# Patient Record
Sex: Male | Born: 1990 | Race: White | Hispanic: No | Marital: Married | Smoking: Never smoker
Health system: Southern US, Community
[De-identification: ages and names within clinical notes are randomized; demographics above are authoritative.]

## PROBLEM LIST (undated history)

## (undated) DIAGNOSIS — D649 Anemia, unspecified: Secondary | ICD-10-CM

## (undated) DIAGNOSIS — Z960 Presence of urogenital implants: Secondary | ICD-10-CM

## (undated) DIAGNOSIS — R51 Headache: Secondary | ICD-10-CM

## (undated) DIAGNOSIS — Z8709 Personal history of other diseases of the respiratory system: Secondary | ICD-10-CM

## (undated) DIAGNOSIS — R339 Retention of urine, unspecified: Secondary | ICD-10-CM

## (undated) DIAGNOSIS — S82001A Unspecified fracture of right patella, initial encounter for closed fracture: Secondary | ICD-10-CM

## (undated) DIAGNOSIS — S82853A Displaced trimalleolar fracture of unspecified lower leg, initial encounter for closed fracture: Secondary | ICD-10-CM

## (undated) DIAGNOSIS — Z229 Carrier of infectious disease, unspecified: Secondary | ICD-10-CM

## (undated) DIAGNOSIS — R519 Headache, unspecified: Secondary | ICD-10-CM

## (undated) DIAGNOSIS — J45909 Unspecified asthma, uncomplicated: Secondary | ICD-10-CM

## (undated) DIAGNOSIS — S92309A Fracture of unspecified metatarsal bone(s), unspecified foot, initial encounter for closed fracture: Secondary | ICD-10-CM

## (undated) DIAGNOSIS — J189 Pneumonia, unspecified organism: Secondary | ICD-10-CM

## (undated) DIAGNOSIS — R509 Fever, unspecified: Secondary | ICD-10-CM

## (undated) DIAGNOSIS — N390 Urinary tract infection, site not specified: Secondary | ICD-10-CM

## (undated) DIAGNOSIS — T07XXXA Unspecified multiple injuries, initial encounter: Secondary | ICD-10-CM

---
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
## 2009-09-13 ENCOUNTER — Emergency Department (HOSPITAL_COMMUNITY): Admission: EM | Admit: 2009-09-13 | Discharge: 2009-09-13 | Payer: Self-pay | Admitting: Emergency Medicine

## 2011-02-08 ENCOUNTER — Emergency Department (HOSPITAL_COMMUNITY): Payer: No Typology Code available for payment source

## 2011-02-08 ENCOUNTER — Emergency Department (HOSPITAL_COMMUNITY)
Admission: EM | Admit: 2011-02-08 | Discharge: 2011-02-09 | Disposition: A | Discharge status: Home / Self Care | Payer: No Typology Code available for payment source | Attending: Emergency Medicine | Admitting: Emergency Medicine

## 2011-02-08 DIAGNOSIS — S40019A Contusion of unspecified shoulder, initial encounter: Secondary | ICD-10-CM | POA: Insufficient documentation

## 2011-02-08 DIAGNOSIS — IMO0002 Reserved for concepts with insufficient information to code with codable children: Secondary | ICD-10-CM | POA: Insufficient documentation

## 2011-02-23 LAB — RAPID STREP SCREEN (MED CTR MEBANE ONLY): Streptococcus, Group A Screen (Direct): NEGATIVE

## 2015-08-20 ENCOUNTER — Inpatient Hospital Stay (HOSPITAL_COMMUNITY): Payer: BLUE CROSS/BLUE SHIELD | Admitting: Anesthesiology

## 2015-08-20 ENCOUNTER — Inpatient Hospital Stay (HOSPITAL_COMMUNITY): Payer: BLUE CROSS/BLUE SHIELD

## 2015-08-20 ENCOUNTER — Emergency Department (HOSPITAL_COMMUNITY): Payer: BLUE CROSS/BLUE SHIELD

## 2015-08-20 ENCOUNTER — Encounter (HOSPITAL_COMMUNITY): Admission: EM | Disposition: A | Discharge status: Home Health | Payer: Self-pay | Source: Home / Self Care

## 2015-08-20 ENCOUNTER — Inpatient Hospital Stay (HOSPITAL_COMMUNITY)
Admission: EM | Admit: 2015-08-20 | Discharge: 2015-08-25 | DRG: 493 | Disposition: A | Discharge status: Home Health | Payer: BLUE CROSS/BLUE SHIELD | Attending: General Surgery | Admitting: General Surgery

## 2015-08-20 DIAGNOSIS — D62 Acute posthemorrhagic anemia: Secondary | ICD-10-CM | POA: Diagnosis not present

## 2015-08-20 DIAGNOSIS — R339 Retention of urine, unspecified: Secondary | ICD-10-CM | POA: Diagnosis not present

## 2015-08-20 DIAGNOSIS — T1490XA Injury, unspecified, initial encounter: Secondary | ICD-10-CM

## 2015-08-20 DIAGNOSIS — Z229 Carrier of infectious disease, unspecified: Secondary | ICD-10-CM

## 2015-08-20 DIAGNOSIS — S82899A Other fracture of unspecified lower leg, initial encounter for closed fracture: Secondary | ICD-10-CM

## 2015-08-20 DIAGNOSIS — Z419 Encounter for procedure for purposes other than remedying health state, unspecified: Secondary | ICD-10-CM

## 2015-08-20 DIAGNOSIS — T07XXXA Unspecified multiple injuries, initial encounter: Secondary | ICD-10-CM

## 2015-08-20 DIAGNOSIS — S93121A Dislocation of metatarsophalangeal joint of right great toe, initial encounter: Secondary | ICD-10-CM | POA: Diagnosis present

## 2015-08-20 DIAGNOSIS — D181 Lymphangioma, any site: Secondary | ICD-10-CM | POA: Diagnosis present

## 2015-08-20 DIAGNOSIS — D649 Anemia, unspecified: Secondary | ICD-10-CM

## 2015-08-20 DIAGNOSIS — R338 Other retention of urine: Secondary | ICD-10-CM | POA: Diagnosis not present

## 2015-08-20 DIAGNOSIS — S82852A Displaced trimalleolar fracture of left lower leg, initial encounter for closed fracture: Secondary | ICD-10-CM | POA: Diagnosis present

## 2015-08-20 DIAGNOSIS — S82892A Other fracture of left lower leg, initial encounter for closed fracture: Secondary | ICD-10-CM | POA: Diagnosis present

## 2015-08-20 DIAGNOSIS — S82001A Unspecified fracture of right patella, initial encounter for closed fracture: Secondary | ICD-10-CM | POA: Diagnosis present

## 2015-08-20 DIAGNOSIS — M25512 Pain in left shoulder: Secondary | ICD-10-CM | POA: Diagnosis present

## 2015-08-20 DIAGNOSIS — S93326A Dislocation of tarsometatarsal joint of unspecified foot, initial encounter: Secondary | ICD-10-CM

## 2015-08-20 DIAGNOSIS — K5900 Constipation, unspecified: Secondary | ICD-10-CM | POA: Diagnosis not present

## 2015-08-20 DIAGNOSIS — S82009A Unspecified fracture of unspecified patella, initial encounter for closed fracture: Secondary | ICD-10-CM

## 2015-08-20 DIAGNOSIS — S82853A Displaced trimalleolar fracture of unspecified lower leg, initial encounter for closed fracture: Secondary | ICD-10-CM

## 2015-08-20 DIAGNOSIS — T1490 Injury, unspecified: Secondary | ICD-10-CM | POA: Diagnosis present

## 2015-08-20 DIAGNOSIS — S92902A Unspecified fracture of left foot, initial encounter for closed fracture: Secondary | ICD-10-CM | POA: Diagnosis present

## 2015-08-20 DIAGNOSIS — S92309A Fracture of unspecified metatarsal bone(s), unspecified foot, initial encounter for closed fracture: Secondary | ICD-10-CM

## 2015-08-20 DIAGNOSIS — S92901A Unspecified fracture of right foot, initial encounter for closed fracture: Secondary | ICD-10-CM | POA: Diagnosis present

## 2015-08-20 HISTORY — DX: Fracture of unspecified metatarsal bone(s), unspecified foot, initial encounter for closed fracture: S92.309A

## 2015-08-20 HISTORY — DX: Anemia, unspecified: D64.9

## 2015-08-20 HISTORY — DX: Carrier of infectious disease, unspecified: Z22.9

## 2015-08-20 HISTORY — DX: Unspecified multiple injuries, initial encounter: T07.XXXA

## 2015-08-20 HISTORY — PX: CLOSED REDUCTION METATARSAL: SHX5774

## 2015-08-20 HISTORY — PX: EXTERNAL FIXATION LEG: SHX1549

## 2015-08-20 HISTORY — DX: Displaced trimalleolar fracture of unspecified lower leg, initial encounter for closed fracture: S82.853A

## 2015-08-20 HISTORY — DX: Unspecified fracture of right patella, initial encounter for closed fracture: S82.001A

## 2015-08-20 LAB — CDS SEROLOGY

## 2015-08-20 LAB — CBC
HCT: 45.7 % (ref 39.0–52.0)
HEMOGLOBIN: 15.4 g/dL (ref 13.0–17.0)
MCH: 28.7 pg (ref 26.0–34.0)
MCHC: 33.7 g/dL (ref 30.0–36.0)
MCV: 85.1 fL (ref 78.0–100.0)
PLATELETS: 297 10*3/uL (ref 150–400)
RBC: 5.37 MIL/uL (ref 4.22–5.81)
RDW: 12.2 % (ref 11.5–15.5)
WBC: 11.1 10*3/uL — AB (ref 4.0–10.5)

## 2015-08-20 LAB — COMPREHENSIVE METABOLIC PANEL
ALK PHOS: 43 U/L (ref 38–126)
ALT: 26 U/L (ref 17–63)
ANION GAP: 13 (ref 5–15)
AST: 39 U/L (ref 15–41)
Albumin: 4.7 g/dL (ref 3.5–5.0)
BUN: 9 mg/dL (ref 6–20)
CALCIUM: 9.9 mg/dL (ref 8.9–10.3)
CO2: 23 mmol/L (ref 22–32)
Chloride: 105 mmol/L (ref 101–111)
Creatinine, Ser: 0.93 mg/dL (ref 0.61–1.24)
Glucose, Bld: 132 mg/dL — ABNORMAL HIGH (ref 65–99)
Potassium: 3.7 mmol/L (ref 3.5–5.1)
SODIUM: 141 mmol/L (ref 135–145)
TOTAL PROTEIN: 7.5 g/dL (ref 6.5–8.1)
Total Bilirubin: 1.4 mg/dL — ABNORMAL HIGH (ref 0.3–1.2)

## 2015-08-20 LAB — SURGICAL PCR SCREEN
MRSA, PCR: NEGATIVE
Staphylococcus aureus: POSITIVE — AB

## 2015-08-20 LAB — ETHANOL

## 2015-08-20 LAB — SAMPLE TO BLOOD BANK

## 2015-08-20 LAB — PROTIME-INR
INR: 1.14 (ref 0.00–1.49)
PROTHROMBIN TIME: 14.8 s (ref 11.6–15.2)

## 2015-08-20 SURGERY — EXTERNAL FIXATION, LOWER EXTREMITY
Anesthesia: General | Laterality: Right

## 2015-08-20 MED ORDER — DOCUSATE SODIUM 100 MG PO CAPS
100.0000 mg | ORAL_CAPSULE | Freq: Two times a day (BID) | ORAL | Status: DC
  Administered 2015-08-21 – 2015-08-24 (×6): 100 mg via ORAL
  Filled 2015-08-20 (×7): qty 1

## 2015-08-20 MED ORDER — HYDROMORPHONE HCL 1 MG/ML IJ SOLN
1.0000 mg | INTRAMUSCULAR | Status: DC | PRN
  Administered 2015-08-20: 1 mg via INTRAVENOUS
  Filled 2015-08-20: qty 1

## 2015-08-20 MED ORDER — GLYCOPYRROLATE 0.2 MG/ML IJ SOLN
INTRAMUSCULAR | Status: DC | PRN
  Administered 2015-08-20: 0.4 mg via INTRAVENOUS

## 2015-08-20 MED ORDER — HYDROMORPHONE HCL 1 MG/ML IJ SOLN
0.2500 mg | INTRAMUSCULAR | Status: DC | PRN
  Administered 2015-08-20 (×2): 0.5 mg via INTRAVENOUS

## 2015-08-20 MED ORDER — ROCURONIUM BROMIDE 50 MG/5ML IV SOLN
INTRAVENOUS | Status: AC
  Filled 2015-08-20: qty 1

## 2015-08-20 MED ORDER — PANTOPRAZOLE SODIUM 40 MG IV SOLR: 40.0000 mg | Freq: Every day | INTRAVENOUS | Status: DC

## 2015-08-20 MED ORDER — MIDAZOLAM HCL 2 MG/2ML IJ SOLN
INTRAMUSCULAR | Status: AC
  Filled 2015-08-20: qty 4

## 2015-08-20 MED ORDER — DEXAMETHASONE SODIUM PHOSPHATE 10 MG/ML IJ SOLN
INTRAMUSCULAR | Status: DC | PRN
  Administered 2015-08-20: 10 mg via INTRAVENOUS

## 2015-08-20 MED ORDER — FENTANYL CITRATE (PF) 100 MCG/2ML IJ SOLN
INTRAMUSCULAR | Status: DC | PRN
  Administered 2015-08-20: 150 ug via INTRAVENOUS
  Administered 2015-08-20: 100 ug via INTRAVENOUS

## 2015-08-20 MED ORDER — PANTOPRAZOLE SODIUM 40 MG PO TBEC
40.0000 mg | DELAYED_RELEASE_TABLET | Freq: Every day | ORAL | Status: DC
  Administered 2015-08-21 – 2015-08-24 (×4): 40 mg via ORAL
  Filled 2015-08-20 (×4): qty 1

## 2015-08-20 MED ORDER — GLYCOPYRROLATE 0.2 MG/ML IJ SOLN
INTRAMUSCULAR | Status: AC
  Filled 2015-08-20: qty 4

## 2015-08-20 MED ORDER — HYDROMORPHONE HCL 1 MG/ML IJ SOLN
1.0000 mg | Freq: Once | INTRAMUSCULAR | Status: AC
Start: 2015-08-20 — End: 2015-08-20
  Administered 2015-08-20: 1 mg via INTRAVENOUS
  Filled 2015-08-20: qty 1

## 2015-08-20 MED ORDER — LACTATED RINGERS IV SOLN
INTRAVENOUS | Status: DC
  Administered 2015-08-21: 06:00:00 via INTRAVENOUS
  Administered 2015-08-22: 85 mL/h via INTRAVENOUS

## 2015-08-20 MED ORDER — METHOCARBAMOL 1000 MG/10ML IJ SOLN
500.0000 mg | Freq: Four times a day (QID) | INTRAVENOUS | Status: DC | PRN
  Administered 2015-08-21 (×2): 500 mg via INTRAVENOUS
  Filled 2015-08-20 (×4): qty 5

## 2015-08-20 MED ORDER — ACETAMINOPHEN 10 MG/ML IV SOLN
INTRAVENOUS | Status: AC
  Filled 2015-08-20: qty 100

## 2015-08-20 MED ORDER — HYDROMORPHONE HCL 1 MG/ML IJ SOLN
INTRAMUSCULAR | Status: AC
  Filled 2015-08-20: qty 1

## 2015-08-20 MED ORDER — CEFAZOLIN SODIUM-DEXTROSE 2-3 GM-% IV SOLR
INTRAVENOUS | Status: DC | PRN
  Administered 2015-08-20: 2 g via INTRAVENOUS

## 2015-08-20 MED ORDER — BACIT-POLY-NEO HC 1 % EX OINT
TOPICAL_OINTMENT | CUTANEOUS | Status: AC
  Filled 2015-08-20: qty 15

## 2015-08-20 MED ORDER — SILVER SULFADIAZINE 1 % EX CREA
TOPICAL_CREAM | Freq: Two times a day (BID) | CUTANEOUS | Status: DC
  Administered 2015-08-22 – 2015-08-25 (×7): via TOPICAL
  Filled 2015-08-20 (×4): qty 85

## 2015-08-20 MED ORDER — BACITRACIN ZINC 500 UNIT/GM EX OINT
TOPICAL_OINTMENT | CUTANEOUS | Status: AC
  Filled 2015-08-20: qty 28.35

## 2015-08-20 MED ORDER — TETANUS-DIPHTH-ACELL PERTUSSIS 5-2.5-18.5 LF-MCG/0.5 IM SUSP
0.5000 mL | Freq: Once | INTRAMUSCULAR | Status: AC
  Administered 2015-08-20: 0.5 mL via INTRAMUSCULAR
  Filled 2015-08-20: qty 0.5

## 2015-08-20 MED ORDER — ONDANSETRON HCL 4 MG/2ML IJ SOLN
INTRAMUSCULAR | Status: DC | PRN
  Administered 2015-08-20: 4 mg via INTRAVENOUS

## 2015-08-20 MED ORDER — LIDOCAINE HCL (CARDIAC) 20 MG/ML IV SOLN
INTRAVENOUS | Status: AC
  Filled 2015-08-20: qty 5

## 2015-08-20 MED ORDER — PROPOFOL 10 MG/ML IV BOLUS
INTRAVENOUS | Status: DC | PRN
  Administered 2015-08-20: 150 mg via INTRAVENOUS

## 2015-08-20 MED ORDER — ACETAMINOPHEN 10 MG/ML IV SOLN
INTRAVENOUS | Status: DC | PRN
  Administered 2015-08-20: 1000 mg via INTRAVENOUS

## 2015-08-20 MED ORDER — ONDANSETRON HCL 4 MG/2ML IJ SOLN
INTRAMUSCULAR | Status: AC
  Filled 2015-08-20: qty 2

## 2015-08-20 MED ORDER — LACTATED RINGERS IV SOLN
INTRAVENOUS | Status: DC | PRN
  Administered 2015-08-20 (×2): via INTRAVENOUS

## 2015-08-20 MED ORDER — ONDANSETRON HCL 4 MG PO TABS: 4.0000 mg | ORAL_TABLET | Freq: Four times a day (QID) | ORAL | Status: DC | PRN

## 2015-08-20 MED ORDER — SILVER SULFADIAZINE 1 % EX CREA
TOPICAL_CREAM | Freq: Once | CUTANEOUS | Status: AC
  Administered 2015-08-20: 1 via TOPICAL
  Filled 2015-08-20: qty 85

## 2015-08-20 MED ORDER — PROPOFOL 10 MG/ML IV BOLUS
INTRAVENOUS | Status: AC
  Filled 2015-08-20: qty 20

## 2015-08-20 MED ORDER — ALBUMIN HUMAN 5 % IV SOLN
INTRAVENOUS | Status: DC | PRN
  Administered 2015-08-20: 22:00:00 via INTRAVENOUS

## 2015-08-20 MED ORDER — SUCCINYLCHOLINE CHLORIDE 200 MG/10ML IV SOSY
PREFILLED_SYRINGE | INTRAVENOUS | Status: DC | PRN
  Administered 2015-08-20: 120 mg via INTRAVENOUS

## 2015-08-20 MED ORDER — MIDAZOLAM HCL 5 MG/5ML IJ SOLN
INTRAMUSCULAR | Status: DC | PRN
  Administered 2015-08-20: 2 mg via INTRAVENOUS

## 2015-08-20 MED ORDER — POTASSIUM CHLORIDE IN NACL 20-0.45 MEQ/L-% IV SOLN
INTRAVENOUS | Status: DC
  Administered 2015-08-20: 19:00:00 via INTRAVENOUS
  Filled 2015-08-20 (×6): qty 1000

## 2015-08-20 MED ORDER — METHOCARBAMOL 500 MG PO TABS
500.0000 mg | ORAL_TABLET | Freq: Four times a day (QID) | ORAL | Status: DC | PRN
  Administered 2015-08-22: 500 mg via ORAL
  Filled 2015-08-20: qty 1

## 2015-08-20 MED ORDER — HYDROMORPHONE HCL 1 MG/ML IJ SOLN
1.0000 mg | Freq: Once | INTRAMUSCULAR | Status: AC
  Administered 2015-08-20: 1 mg via INTRAVENOUS
  Filled 2015-08-20: qty 1

## 2015-08-20 MED ORDER — NEOSTIGMINE METHYLSULFATE 10 MG/10ML IV SOLN
INTRAVENOUS | Status: AC
  Filled 2015-08-20: qty 1

## 2015-08-20 MED ORDER — CEFAZOLIN (ANCEF) 1 G IV SOLR: 2.0000 g | INTRAVENOUS | Status: DC

## 2015-08-20 MED ORDER — DEXAMETHASONE SODIUM PHOSPHATE 10 MG/ML IJ SOLN
INTRAMUSCULAR | Status: AC
  Filled 2015-08-20: qty 1

## 2015-08-20 MED ORDER — PROMETHAZINE HCL 25 MG/ML IJ SOLN
6.2500 mg | INTRAMUSCULAR | Status: DC | PRN
Start: 2015-08-20 — End: 2015-08-21

## 2015-08-20 MED ORDER — FENTANYL CITRATE (PF) 250 MCG/5ML IJ SOLN
INTRAMUSCULAR | Status: AC
  Filled 2015-08-20: qty 5

## 2015-08-20 MED ORDER — OXYCODONE HCL 5 MG PO TABS
5.0000 mg | ORAL_TABLET | ORAL | Status: DC | PRN
  Administered 2015-08-21 – 2015-08-25 (×20): 15 mg via ORAL
  Filled 2015-08-20 (×22): qty 3

## 2015-08-20 MED ORDER — CEFAZOLIN SODIUM 1-5 GM-% IV SOLN
1.0000 g | Freq: Once | INTRAVENOUS | Status: AC
  Administered 2015-08-20: 1 g via INTRAVENOUS
  Filled 2015-08-20: qty 50

## 2015-08-20 MED ORDER — HYDROMORPHONE HCL 1 MG/ML IJ SOLN
2.0000 mg | Freq: Once | INTRAMUSCULAR | Status: AC
  Administered 2015-08-20: 2 mg via INTRAVENOUS
  Filled 2015-08-20: qty 2

## 2015-08-20 MED ORDER — NEOSTIGMINE METHYLSULFATE 10 MG/10ML IV SOLN
INTRAVENOUS | Status: DC | PRN
  Administered 2015-08-20: 3 mg via INTRAVENOUS

## 2015-08-20 MED ORDER — POLYETHYLENE GLYCOL 3350 17 G PO PACK
17.0000 g | PACK | Freq: Every day | ORAL | Status: DC
  Administered 2015-08-21 – 2015-08-25 (×4): 17 g via ORAL
  Filled 2015-08-20 (×4): qty 1

## 2015-08-20 MED ORDER — ROCURONIUM BROMIDE 100 MG/10ML IV SOLN
INTRAVENOUS | Status: DC | PRN
  Administered 2015-08-20: 20 mg via INTRAVENOUS
  Administered 2015-08-20: 30 mg via INTRAVENOUS

## 2015-08-20 MED ORDER — BACIT-POLY-NEO HC 1 % EX OINT
TOPICAL_OINTMENT | CUTANEOUS | Status: DC | PRN
  Administered 2015-08-20: 1 via TOPICAL

## 2015-08-20 MED ORDER — ONDANSETRON HCL 4 MG/2ML IJ SOLN
4.0000 mg | Freq: Four times a day (QID) | INTRAMUSCULAR | Status: DC | PRN
  Administered 2015-08-21 – 2015-08-24 (×2): 4 mg via INTRAVENOUS
  Filled 2015-08-20 (×2): qty 2

## 2015-08-20 SURGICAL SUPPLY — 42 items
BANDAGE ELASTIC 4 VELCRO ST LF (GAUZE/BANDAGES/DRESSINGS) ×4 IMPLANT
BANDAGE ELASTIC 6 VELCRO ST LF (GAUZE/BANDAGES/DRESSINGS) ×4 IMPLANT
BAR EXFX 400X11 NS LF (EXFIX) ×2
BAR GLASS FIBER EXFX 11X400 (EXFIX) ×2 IMPLANT
BNDG CONFORM 3 STRL LF (GAUZE/BANDAGES/DRESSINGS) ×2 IMPLANT
CLAMP BLUE BAR TO PIN (MISCELLANEOUS) ×4 IMPLANT
COTTON STERILE ROLL (GAUZE/BANDAGES/DRESSINGS) ×2 IMPLANT
DRAPE C-ARM 42X72 X-RAY (DRAPES) ×2 IMPLANT
DRAPE U-SHAPE 47X51 STRL (DRAPES) ×4 IMPLANT
DRSG ADAPTIC 3X8 NADH LF (GAUZE/BANDAGES/DRESSINGS) ×4 IMPLANT
DRSG PAD ABDOMINAL 8X10 ST (GAUZE/BANDAGES/DRESSINGS) ×4 IMPLANT
DURAPREP 26ML APPLICATOR (WOUND CARE) ×4 IMPLANT
ELECT REM PT RETURN 9FT ADLT (ELECTROSURGICAL) ×4
ELECTRODE REM PT RTRN 9FT ADLT (ELECTROSURGICAL) ×2 IMPLANT
GAUZE XEROFORM 5X9 LF (GAUZE/BANDAGES/DRESSINGS) ×2 IMPLANT
GLOVE BIO SURGEON STRL SZ8 (GLOVE) ×4 IMPLANT
GLOVE BIOGEL PI IND STRL 8 (GLOVE) ×4 IMPLANT
GLOVE BIOGEL PI INDICATOR 8 (GLOVE) ×4
GLOVE ORTHO TXT STRL SZ7.5 (GLOVE) ×4 IMPLANT
GOWN STRL REUS W/ TWL LRG LVL3 (GOWN DISPOSABLE) ×2 IMPLANT
GOWN STRL REUS W/TWL 2XL LVL3 (GOWN DISPOSABLE) ×8 IMPLANT
GOWN STRL REUS W/TWL LRG LVL3 (GOWN DISPOSABLE) ×4
HALF PIN 5.0X160 (PIN) ×4 IMPLANT
IMMOBILIZER KNEE 20 (SOFTGOODS) ×4
IMMOBILIZER KNEE 20 THIGH 36 (SOFTGOODS) IMPLANT
KIT BASIN OR (CUSTOM PROCEDURE TRAY) ×4 IMPLANT
KIT ROOM TURNOVER OR (KITS) ×4 IMPLANT
NS IRRIG 1000ML POUR BTL (IV SOLUTION) ×4 IMPLANT
PACK ORTHO EXTREMITY (CUSTOM PROCEDURE TRAY) ×4 IMPLANT
PAD ARMBOARD 7.5X6 YLW CONV (MISCELLANEOUS) ×6 IMPLANT
PAD CAST 4YDX4 CTTN HI CHSV (CAST SUPPLIES) ×2 IMPLANT
PADDING CAST ABS 4INX4YD NS (CAST SUPPLIES) ×2
PADDING CAST ABS COTTON 4X4 ST (CAST SUPPLIES) IMPLANT
PADDING CAST COTTON 4X4 STRL (CAST SUPPLIES) ×4
PIN CLAMP 2BAR 75MM BLUE (PIN) ×2 IMPLANT
PIN TRANSFIXING 5.0 (PIN) ×2 IMPLANT
SPLINT PLASTER CAST XFAST 5X30 (CAST SUPPLIES) IMPLANT
SPLINT PLASTER XFAST SET 5X30 (CAST SUPPLIES) ×2
STOCKINETTE 6  STRL (DRAPES) ×2
STOCKINETTE 6 STRL (DRAPES) ×2 IMPLANT
TOWEL OR 17X24 6PK STRL BLUE (TOWEL DISPOSABLE) ×4 IMPLANT
TOWEL OR 17X26 10 PK STRL BLUE (TOWEL DISPOSABLE) ×4 IMPLANT

## 2015-08-20 NOTE — ED Notes (Addendum)
Warm blankets times 3 and applied ice packs to left ankle and right ankle
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
## 2015-08-20 NOTE — Anesthesia Preprocedure Evaluation (Addendum)
Anesthesia Evaluation  Patient identified by MRN, date of birth, ID band Patient awake    Reviewed: Allergy & Precautions, NPO status , Patient's Chart, lab work & pertinent test results  Airway Mallampati: I  TM Distance: >3 FB Neck ROM: Full    Dental  (+) Teeth Intact, Dental Advisory Given   Pulmonary neg pulmonary ROS,    Pulmonary exam normal        Cardiovascular negative cardio ROS Normal cardiovascular exam     Neuro/Psych negative neurological ROS  negative psych ROS   GI/Hepatic negative GI ROS, Neg liver ROS,   Endo/Other  negative endocrine ROS  Renal/GU negative Renal ROS  negative genitourinary   Musculoskeletal negative musculoskeletal ROS (+)   Abdominal   Peds negative pediatric ROS (+)  Hematology negative hematology ROS (+)   Anesthesia Other Findings   Reproductive/Obstetrics negative OB ROS                            Anesthesia Physical Anesthesia Plan  ASA: II and emergent  Anesthesia Plan: General   Post-op Pain Management:    Induction: Intravenous, Rapid sequence and Cricoid pressure planned  Airway Management Planned: Oral ETT  Additional Equipment:   Intra-op Plan:   Post-operative Plan: Extubation in OR  Informed Consent: I have reviewed the patients History and Physical, chart, labs and discussed the procedure including the risks, benefits and alternatives for the proposed anesthesia with the patient or authorized representative who has indicated his/her understanding and acceptance.   Dental advisory given  Plan Discussed with: CRNA, Anesthesiologist and Surgeon  Anesthesia Plan Comments:        Anesthesia Quick Evaluation

## 2015-08-20 NOTE — Brief Op Note (Signed)
08/20/2015  11:18 PM  PATIENT:  Nathaniel Aguilar  24 y.o. male  PRE-OPERATIVE DIAGNOSIS:  Fractured ankle  POST-OPERATIVE DIAGNOSIS:  Fractured ankle  PROCEDURE:  Procedure(s): EXTERNAL FIXATION LEG (Left) CLOSED REDUCTION METATARSAL (Right)  SURGEON:  Surgeon(s) and Role:    * Melina Schools, MD - Primary  PHYSICIAN ASSISTANT:   ASSISTANTS: Carmen Mayo   ANESTHESIA:   general  EBL:  Total I/O In: 8250 [I.V.:1200; IV Piggyback:250] Out: -   BLOOD ADMINISTERED:none  DRAINS: none   LOCAL MEDICATIONS USED:  NONE  SPECIMEN:  No Specimen  DISPOSITION OF SPECIMEN:  N/A  COUNTS:  YES  TOURNIQUET:  * No tourniquets in log *  DICTATION: .Other Dictation: Dictation Number [id number]  PLAN OF CARE: Admit to inpatient   PATIENT DISPOSITION:  PACU - hemodynamically stable.

## 2015-08-20 NOTE — Transfer of Care (Signed)
Immediate Anesthesia Transfer of Care Note  Patient: Nathaniel Aguilar  Procedure(s) Performed: Procedure(s): EXTERNAL FIXATION LEG (Left) CLOSED REDUCTION METATARSAL (Right)  Patient Location: PACU  Anesthesia Type:General  Level of Consciousness: responds to stimulation  Airway & Oxygen Therapy: Patient Spontanous Breathing and Patient connected to face mask oxygen  Post-op Assessment: Report given to RN and Post -op Vital signs reviewed and stable  Post vital signs: Reviewed and stable  Last Vitals:  Filed Vitals:   08/20/15 2313  BP:   Pulse: 79  Temp: 36.6 C  Resp: 9    Complications: No apparent anesthesia complications

## 2015-08-20 NOTE — Anesthesia Procedure Notes (Signed)
Procedure Name: Intubation Date/Time: 08/20/2015 9:44 PM Performed by: Ignacia Bayley Pre-anesthesia Checklist: Patient identified, Emergency Drugs available, Suction available and Patient being monitored Patient Re-evaluated:Patient Re-evaluated prior to inductionOxygen Delivery Method: Circle system utilized Preoxygenation: Pre-oxygenation with 100% oxygen Intubation Type: IV induction, Cricoid Pressure applied and Rapid sequence Laryngoscope Size: Miller and 2 Grade View: Grade I Tube type: Oral Tube size: 7.5 mm Number of attempts: 1 Airway Equipment and Method: Stylet Placement Confirmation: ETT inserted through vocal cords under direct vision,  positive ETCO2,  CO2 detector and breath sounds checked- equal and bilateral Secured at: 22 cm Tube secured with: Tape Dental Injury: Teeth and Oropharynx as per pre-operative assessment

## 2015-08-20 NOTE — Progress Notes (Signed)
Orthopedic Tech Progress Note Patient Details:  Nathaniel Aguilar 05-01-1991 [id number]  Ortho Devices Type of Ortho Device: Ace wrap, Post (short leg) splint, Stirrup splint Ortho Device/Splint Location: Bilateral short leg splints with stirrups Ortho Device/Splint Interventions: Application   Nathaniel Aguilar 08/20/2015, 5:21 PM
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
## 2015-08-21 ENCOUNTER — Inpatient Hospital Stay (HOSPITAL_COMMUNITY): Payer: BLUE CROSS/BLUE SHIELD

## 2015-08-21 MED ORDER — MUPIROCIN 2 % EX OINT
TOPICAL_OINTMENT | Freq: Two times a day (BID) | CUTANEOUS | Status: DC
  Administered 2015-08-21: 1 via NASAL
  Administered 2015-08-21 – 2015-08-25 (×8): via NASAL
  Filled 2015-08-21 (×2): qty 22

## 2015-08-21 MED ORDER — ACETAMINOPHEN 325 MG PO TABS
650.0000 mg | ORAL_TABLET | Freq: Four times a day (QID) | ORAL | Status: DC | PRN
Start: 2015-08-21 — End: 2015-08-25
  Administered 2015-08-23 – 2015-08-24 (×3): 650 mg via ORAL
  Filled 2015-08-21 (×4): qty 2

## 2015-08-21 MED ORDER — METOCLOPRAMIDE HCL 5 MG/ML IJ SOLN
5.0000 mg | Freq: Three times a day (TID) | INTRAMUSCULAR | Status: DC | PRN
  Administered 2015-08-24: 10 mg via INTRAVENOUS
  Filled 2015-08-21: qty 2

## 2015-08-21 MED ORDER — MORPHINE SULFATE (PF) 2 MG/ML IV SOLN
2.0000 mg | INTRAVENOUS | Status: DC | PRN
  Administered 2015-08-21 – 2015-08-24 (×14): 2 mg via INTRAVENOUS
  Filled 2015-08-21 (×14): qty 1

## 2015-08-21 MED ORDER — ONDANSETRON HCL 4 MG PO TABS: 4.0000 mg | ORAL_TABLET | Freq: Four times a day (QID) | ORAL | Status: DC | PRN

## 2015-08-21 MED ORDER — METOCLOPRAMIDE HCL 5 MG PO TABS: 5.0000 mg | ORAL_TABLET | Freq: Three times a day (TID) | ORAL | Status: DC | PRN

## 2015-08-21 MED ORDER — ONDANSETRON HCL 4 MG/2ML IJ SOLN: 4.0000 mg | Freq: Four times a day (QID) | INTRAMUSCULAR | Status: DC | PRN

## 2015-08-21 MED ORDER — OXYCODONE HCL 5 MG PO TABS: 5.0000 mg | ORAL_TABLET | ORAL | Status: DC | PRN

## 2015-08-21 MED ORDER — CEFAZOLIN SODIUM 1-5 GM-% IV SOLN
1.0000 g | Freq: Four times a day (QID) | INTRAVENOUS | Status: AC
  Administered 2015-08-21 (×3): 1 g via INTRAVENOUS
  Filled 2015-08-21 (×3): qty 50

## 2015-08-21 MED ORDER — ACETAMINOPHEN 650 MG RE SUPP: 650.0000 mg | Freq: Four times a day (QID) | RECTAL | Status: DC | PRN

## 2015-08-21 MED ORDER — ENOXAPARIN SODIUM 40 MG/0.4ML ~~LOC~~ SOLN: 40.0000 mg | SUBCUTANEOUS | Status: DC

## 2015-08-21 MED ORDER — ENOXAPARIN SODIUM 40 MG/0.4ML ~~LOC~~ SOLN
40.0000 mg | SUBCUTANEOUS | Status: DC
  Administered 2015-08-21 – 2015-08-24 (×4): 40 mg via SUBCUTANEOUS
  Filled 2015-08-21 (×4): qty 0.4
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
## 2015-08-21 NOTE — Progress Notes (Signed)
Patient ID: Gabreil [place], male   DOB: Aug 11, 1991, 24 y.o.   MRN: [id number] Subjective: 1 Day Post-Op Procedure(s) (LRB): EXTERNAL FIXATION LEG (Left) CLOSED REDUCTION METATARSAL (Right)    Patient stable no events.  Objective:   VITALS:   Filed Vitals:   08/21/15 2100  BP: 142/71  Pulse: 113  Temp: 99.3 F (37.4 C)  Resp: 18    External fixator intact Splints both lower extremities No significant pain complaints Swelling appropriate  LABS  Recent Labs  08/20/15 1257  HGB 15.4  HCT 45.7  WBC 11.1*  PLT 297     Recent Labs  08/20/15 1257  NA 141  K 3.7  BUN 9  CREATININE 0.93  GLUCOSE 132*     Recent Labs  08/20/15 1257  INR 1.14     Assessment/Plan: 1 Day Post-Op Procedure(s) (LRB): EXTERNAL FIXATION LEG (Left) CLOSED REDUCTION METATARSAL (Right)   Plan:  As discussed with Dr. Rolena Infante he will be consulting Dr. Marcelino Scot for definitive management of his lower extremity injuries Monitor swelling NWB BLE

## 2015-08-21 NOTE — Anesthesia Postprocedure Evaluation (Signed)
Anesthesia Post Note  Patient: Nathaniel Aguilar  Procedure(s) Performed: Procedure(s) (LRB): EXTERNAL FIXATION LEG (Left) CLOSED REDUCTION METATARSAL (Right)  Anesthesia type: general  Patient location: PACU  Post pain: Pain level controlled  Post assessment: Patient's Cardiovascular Status Stable  Last Vitals:  Filed Vitals:   08/21/15 0034  BP: 156/85  Pulse: 99  Temp: 36.7 C  Resp: 18    Post vital signs: Reviewed and stable  Level of consciousness: sedated  Complications: No apparent anesthesia complications
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
## 2015-08-21 NOTE — Progress Notes (Signed)
Lab came to draw blood on the patient. They were unable to gain venous access due to to the patient's dressings to upper and lower extremities. Called Dr. Hulen Skains and he stated to "hold off on the labs for now". Will continue to monitor.
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
## 2015-08-22 ENCOUNTER — Encounter (HOSPITAL_COMMUNITY): Payer: Self-pay | Admitting: Orthopedic Surgery

## 2015-08-22 LAB — CBC
HEMATOCRIT: 35.1 % — AB (ref 39.0–52.0)
Hemoglobin: 11.7 g/dL — ABNORMAL LOW (ref 13.0–17.0)
MCH: 29 pg (ref 26.0–34.0)
MCHC: 33.3 g/dL (ref 30.0–36.0)
MCV: 87.1 fL (ref 78.0–100.0)
PLATELETS: 192 10*3/uL (ref 150–400)
RBC: 4.03 MIL/uL — ABNORMAL LOW (ref 4.22–5.81)
RDW: 12.2 % (ref 11.5–15.5)
WBC: 9.1 10*3/uL (ref 4.0–10.5)

## 2015-08-22 LAB — BASIC METABOLIC PANEL
ANION GAP: 7 (ref 5–15)
BUN: 8 mg/dL (ref 6–20)
CALCIUM: 9 mg/dL (ref 8.9–10.3)
CO2: 31 mmol/L (ref 22–32)
Chloride: 100 mmol/L — ABNORMAL LOW (ref 101–111)
Creatinine, Ser: 0.96 mg/dL (ref 0.61–1.24)
Glucose, Bld: 121 mg/dL — ABNORMAL HIGH (ref 65–99)
Potassium: 3.9 mmol/L (ref 3.5–5.1)
SODIUM: 138 mmol/L (ref 135–145)

## 2015-08-22 MED ORDER — METHOCARBAMOL 1000 MG/10ML IJ SOLN
1000.0000 mg | Freq: Four times a day (QID) | INTRAVENOUS | Status: DC | PRN
  Filled 2015-08-22: qty 10

## 2015-08-22 MED ORDER — METHOCARBAMOL 500 MG PO TABS
1000.0000 mg | ORAL_TABLET | Freq: Four times a day (QID) | ORAL | Status: DC | PRN
  Administered 2015-08-22 – 2015-08-25 (×8): 1000 mg via ORAL
  Filled 2015-08-22 (×9): qty 2
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
## 2015-08-22 NOTE — Progress Notes (Addendum)
Nathaniel Aguilar  MRN: [id number] DOB/Age: Apr 04, 1991 24 y.o. Physician: Ander Slade, M.D. 2 Days Post-Op Procedure(s) (LRB): EXTERNAL FIXATION LEG (Left) CLOSED REDUCTION METATARSAL (Right)  Subjective: Resting comfortably in bedside chair, A and O, family at bedside Vital Signs Temp:  [98.2 F (36.8 C)-99.6 F (37.6 C)] 99.6 F (37.6 C) (10/02 0457) Pulse Rate:  [109-115] 115 (10/02 0457) Resp:  [18] 18 (10/02 0457) BP: (128-144)/(71-75) 144/72 mmHg (10/02 0457) SpO2:  [96 %-100 %] 96 % (10/02 0457)  Lab Results  Recent Labs  08/20/15 1257  WBC 11.1*  HGB 15.4  HCT 45.7  PLT 297   BMET  Recent Labs  08/20/15 1257  NA 141  K 3.7  CL 105  CO2 23  GLUCOSE 132*  BUN 9  CREATININE 0.93  CALCIUM 9.9   INR  Date Value Ref Range Status  08/20/2015 1.14 0.00 - 1.49 Final     Exam  Right knee with diffuse swelling and resolving ecchymosis, lateral superficial skin abrasion. Right ankle splinted. Ex fix to LLE, dressings intact  CT's reviewed: Complex left ankle fracture dislocation with continued subluxation Comminuted fractures left midfoot. Non displaced right inferior pole patella fracture.  Plan Anticipate review of orthopedic injuries by trauma specialist tomorrow. Will place NPO after midnight incase of surgical opportunity tomorrow  Nathaniel Aguilar M Caley Volkert 08/22/2015, 11:01 AM    Contact # [phone] Additional findings:Right midfoot fractures as noted on recent CT

## 2015-08-22 NOTE — Progress Notes (Signed)
Patient/patients mother have concerns because the patient went from 2128 until 0451 without pain medication. During hourly rounds completed during this gap in pain medication administration the patient was asleep and RN did not awaken patient. RN informed patient/patients mother that it is pertinent that the patient hit the call button for pain medications when he needs it because pain medications are not scheduled and only administered on an as needed basis. Patient/patients mother expressed that they did not know that pain medications were not scheduled and assumed that the RN would bring in the medication without an indication to do so. Patient/patients mother expressed that the patient would like an RN to awaken him every 1-2 hours to administer him pain medication even if the patient is asleep. RN will report off to day shift RN the requested plan of care. RN will also report off to day shift RN per order set dressing change that needs to be completed. Nursing will continue to monitor.
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
## 2015-08-22 NOTE — Progress Notes (Signed)
Removed the indwelling foley @1130  per MD order. Patient was not able to void. Scan showed 928 cc of urinary retention @ 1620. Attempted a I/O cath, but feel resist and was not able to get urine. Notified Dr. Ninfa Linden, received an order for indwelling foley. 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 Notified Dr. Ninfa Linden and get the order for cude. Asked the Charge Nurse to help, called the nurse on 4W and 6N to get help for inserting the Cude. The family just got really upset and complained about it took too long to place a foley successfully. Explain th patient and family about the protocol when patient is due to void. The charge nurse also addressed family concerns to the [facility]. Message recelive by [facility] and stated to follow up with the family and patient. RN [employer]  From [address] placed a Cude @1850  and patient had 1100 output right away. Patient tolerated well and no discomfort from bladder.
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
## 2015-08-22 NOTE — Progress Notes (Signed)
Patient off the floor. Awaiting definitive orthopedic surgical intervention next week.

## 2015-08-23 ENCOUNTER — Encounter (HOSPITAL_COMMUNITY): Payer: Self-pay | Admitting: General Practice

## 2015-08-23 LAB — CBC
HCT: 32.4 % — ABNORMAL LOW (ref 39.0–52.0)
Hemoglobin: 10.8 g/dL — ABNORMAL LOW (ref 13.0–17.0)
MCH: 29 pg (ref 26.0–34.0)
MCHC: 33.3 g/dL (ref 30.0–36.0)
MCV: 86.9 fL (ref 78.0–100.0)
PLATELETS: 192 10*3/uL (ref 150–400)
RBC: 3.73 MIL/uL — ABNORMAL LOW (ref 4.22–5.81)
RDW: 12.2 % (ref 11.5–15.5)
WBC: 6.5 10*3/uL (ref 4.0–10.5)

## 2015-08-23 MED ORDER — BETHANECHOL CHLORIDE 25 MG PO TABS
25.0000 mg | ORAL_TABLET | Freq: Three times a day (TID) | ORAL | Status: DC
  Administered 2015-08-23 – 2015-08-24 (×6): 25 mg via ORAL
  Filled 2015-08-23 (×6): qty 1

## 2015-08-23 MED ORDER — TAMSULOSIN HCL 0.4 MG PO CAPS
0.4000 mg | ORAL_CAPSULE | Freq: Every day | ORAL | Status: DC
  Administered 2015-08-23 – 2015-08-25 (×3): 0.4 mg via ORAL
  Filled 2015-08-23 (×3): qty 1

## 2015-08-23 NOTE — Progress Notes (Signed)
    Subjective: 3 Days Post-Op Procedure(s) (LRB): EXTERNAL FIXATION LEG (Left) CLOSED REDUCTION METATARSAL (Right) Patient reports pain as 3 on 0-10 scale.   Denies CP or SOB.  Voiding without difficulty. Positive flatus. Objective: Vital signs in last 24 hours: Temp:  [98.4 F (36.9 C)-99.9 F (37.7 C)] 98.4 F (36.9 C) (10/03 1528) Pulse Rate:  [94-140] 110 (10/03 1528) Resp:  [18] 18 (10/03 1528) BP: (126-139)/(62-74) 126/65 mmHg (10/03 1528) SpO2:  [96 %-100 %] 100 % (10/03 1528)  Intake/Output from previous day: 10/02 0701 - 10/03 0700 In: 720 [P.O.:720] Out: 2450 [Urine:2450] Intake/Output this shift: Total I/O In: -  Out: 650 [Urine:650]  Labs:  Recent Labs  08/22/15 1130 08/23/15 0634  HGB 11.7* 10.8*    Recent Labs  08/22/15 1130 08/23/15 0634  WBC 9.1 6.5  RBC 4.03* 3.73*  HCT 35.1* 32.4*  PLT 192 192    Recent Labs  08/22/15 1130  NA 138  K 3.9  CL 100*  CO2 31  BUN 8  CREATININE 0.96  GLUCOSE 121*  CALCIUM 9.0   No results for input(s): LABPT, INR in the last 72 hours.  Physical Exam: Neurologically intact ABD soft Intact pulses distally No cellulitis present Compartment soft skin intact.  swelling decreasing  Assessment/Plan: 3 Days Post-Op Procedure(s) (LRB): EXTERNAL FIXATION LEG (Left) CLOSED REDUCTION METATARSAL (Right) Advance diet Up with therapy  Spoke with Dr Doran Durand - reviewed images.  Although the left ankle remains laterally subluxed the skin is improving and no longer tented and at risk for breakdown.  Given that he would not recommend repeat manipulation.  Will plan on removal of ex-fix and ORIF next week once the swelling has gone done. Lovenox for DVT prevention as he is NWB bilateral LE Right lisfranc injury reduced and in good position.  Will continue pin fixation for 6 weeks for definitive fx management  Continue care     Savien Mamula D for Dr. Melina Schools [facility] [phone] 08/23/2015, 5:36 PM
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
## 2015-08-23 NOTE — Progress Notes (Signed)
Rehab Admissions Coordinator Note:  Patient was screened by Retta Diones for appropriateness for an Inpatient Acute Rehab Consult.  At this time, patient is supervision with bed mobility and minguard for transfers.  He may not need an inpatient rehab stay.  However, a rehab consult can be ordered if patient declines in mobility or MD prefers to have a rehab consult completed.  Retta Diones 08/23/2015, 8:33 AM  I can be reached at [phone].
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
## 2015-08-23 NOTE — Progress Notes (Signed)
Orthopedic Tech Progress Note Patient Details:  Nathaniel Aguilar 01/07/1991 [id number]  Ortho Devices Type of Ortho Device: Ace wrap, Post (short leg) splint Ortho Device/Splint Location: Bilateral short leg splints with stirrups Ortho Device/Splint Interventions: Application   [place] Pink 08/23/2015, 6:00 PM

## 2015-08-24 LAB — CBC
HCT: 32.2 % — ABNORMAL LOW (ref 39.0–52.0)
Hemoglobin: 10.8 g/dL — ABNORMAL LOW (ref 13.0–17.0)
MCH: 28.8 pg (ref 26.0–34.0)
MCHC: 33.5 g/dL (ref 30.0–36.0)
MCV: 85.9 fL (ref 78.0–100.0)
PLATELETS: 215 10*3/uL (ref 150–400)
RBC: 3.75 MIL/uL — AB (ref 4.22–5.81)
RDW: 11.9 % (ref 11.5–15.5)
WBC: 5.9 10*3/uL (ref 4.0–10.5)

## 2015-08-24 LAB — BASIC METABOLIC PANEL WITH GFR
Anion gap: 5 (ref 5–15)
BUN: 7 mg/dL (ref 6–20)
CO2: 31 mmol/L (ref 22–32)
Calcium: 8.6 mg/dL — ABNORMAL LOW (ref 8.9–10.3)
Chloride: 101 mmol/L (ref 101–111)
Creatinine, Ser: 0.8 mg/dL (ref 0.61–1.24)
GFR calc Af Amer: >60 mL/min
GFR calc non Af Amer: >60 mL/min
Glucose, Bld: 110 mg/dL — ABNORMAL HIGH (ref 65–99)
Potassium: 3.9 mmol/L (ref 3.5–5.1)
Sodium: 137 mmol/L (ref 135–145)

## 2015-08-24 MED ORDER — ENOXAPARIN SODIUM 40 MG/0.4ML ~~LOC~~ SOLN: 40.0000 mg | SUBCUTANEOUS | Status: DC

## 2015-08-24 MED ORDER — DOCUSATE SODIUM 100 MG PO CAPS: 100.0000 mg | ORAL_CAPSULE | Freq: Two times a day (BID) | ORAL | Status: DC | PRN

## 2015-08-24 MED ORDER — MAGNESIUM HYDROXIDE 400 MG/5ML PO SUSP: 30.0000 mL | Freq: Every day | ORAL | Status: DC | PRN

## 2015-08-24 MED ORDER — METHOCARBAMOL 500 MG PO TABS: 1000.0000 mg | ORAL_TABLET | Freq: Four times a day (QID) | ORAL | Status: DC | PRN

## 2015-08-24 MED ORDER — IOHEXOL 350 MG/ML SOLN
100.0000 mL | Freq: Once | INTRAVENOUS | Status: AC | PRN
  Administered 2015-08-20: 100 mL via INTRAVENOUS

## 2015-08-24 MED ORDER — POLYETHYLENE GLYCOL 3350 17 G PO PACK: 17.0000 g | PACK | Freq: Every day | ORAL | Status: DC

## 2015-08-24 MED ORDER — MAGNESIUM CITRATE PO SOLN
1.0000 | Freq: Once | ORAL | Status: AC
  Administered 2015-08-24: 1 via ORAL
  Filled 2015-08-24: qty 296

## 2015-08-24 MED ORDER — SILVER SULFADIAZINE 1 % EX CREA: TOPICAL_CREAM | Freq: Two times a day (BID) | CUTANEOUS | Status: DC

## 2015-08-24 MED ORDER — ACETAMINOPHEN 325 MG PO TABS: 650.0000 mg | ORAL_TABLET | Freq: Four times a day (QID) | ORAL | Status: DC | PRN

## 2015-08-24 MED ORDER — OXYCODONE HCL 5 MG PO TABS: 5.0000 mg | ORAL_TABLET | ORAL | Status: DC | PRN

## 2015-08-24 MED ORDER — MUPIROCIN 2 % EX OINT: TOPICAL_OINTMENT | Freq: Two times a day (BID) | CUTANEOUS | Status: DC
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
## 2015-08-24 NOTE — Progress Notes (Signed)
Attempted to meet with pt to discuss [facility] and DME arrangements.  Pt sleeping at this time.  Will come back when pt awake and able to talk with me.    Reinaldo Raddle, RN, BSN  Trauma/Neuro ICU Case Manager [phone]

## 2015-08-24 NOTE — Progress Notes (Signed)
    Subjective: 4 Days Post-Op Procedure(s) (LRB): EXTERNAL FIXATION LEG (Left) CLOSED REDUCTION METATARSAL (Right) Patient reports pain as 2 on 0-10 scale.   Denies CP or SOB.  Voiding without difficulty. Positive flatus. Objective: Vital signs in last 24 hours: Temp:  [98.4 F (36.9 C)-98.9 F (37.2 C)] 98.5 F (36.9 C) (10/04 0452) Pulse Rate:  [92-117] 92 (10/04 0452) Resp:  [18-20] 18 (10/04 0452) BP: (126-141)/(65-75) 132/75 mmHg (10/04 0452) SpO2:  [90 %-100 %] 90 % (10/04 0452)  Intake/Output from previous day: 10/03 0701 - 10/04 0700 In: 4972.3 [I.V.:4972.3] Out: 1650 [Urine:1650] Intake/Output this shift:    Labs:  Recent Labs  08/22/15 1130 08/23/15 0634 08/24/15 0545  HGB 11.7* 10.8* 10.8*    Recent Labs  08/23/15 0634 08/24/15 0545  WBC 6.5 5.9  RBC 3.73* 3.75*  HCT 32.4* 32.2*  PLT 192 215    Recent Labs  08/22/15 1130 08/24/15 0545  NA 138 137  K 3.9 3.9  CL 100* 101  CO2 31 31  BUN 8 7  CREATININE 0.96 0.80  GLUCOSE 121* 110*  CALCIUM 9.0 8.6*   No results for input(s): LABPT, INR in the last 72 hours.  Physical Exam: Neurologically intact Intact pulses distally Incision: dressing C/D/I Compartment soft skin: no blisters noted over left ankle  Assessment/Plan: 4 Days Post-Op Procedure(s) (LRB): EXTERNAL FIXATION LEG (Left) CLOSED REDUCTION METATARSAL (Right) Patient doing well Plan: if able to transfer bed to chair then ok for d/c from ortho stand point Will set up f/u with Dr Doran Durand Monday to re-evaluate for definitive fracture management (ORIF) Lovenox for DVT prevention D/C foley if ok with trauma Will also order [facility] nursing for pin care/dressing changes.  Melina Schools D for Dr. Melina Schools [facility] [phone] 08/24/2015, 7:37 AM
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
## 2015-08-24 NOTE — Progress Notes (Signed)
[facility] Trauma Service  Progress Note   LOS: 4 days   Subjective: Pt's pain well controlled.  Working with therapies well.  Has foley catheter in place.  Wants to go downstairs, told him that's okay if sitter goes with him.  He says friends built him a ramp in their house to let him stay there once discharged.  Good urine output.  No BM yet.  Objective: Vital signs in last 24 hours: Temp:  [98.4 F (36.9 C)-98.9 F (37.2 C)] 98.5 F (36.9 C) (10/04 0452) Pulse Rate:  [92-117] 92 (10/04 0452) Resp:  [18-20] 18 (10/04 0452) BP: (126-141)/(65-75) 132/75 mmHg (10/04 0452) SpO2:  [90 %-100 %] 90 % (10/04 0452) Last BM Date:  (PTA)  Lab Results:  CBC  Recent Labs  08/23/15 0634 08/24/15 0545  WBC 6.5 5.9  HGB 10.8* 10.8*  HCT 32.4* 32.2*  PLT 192 215   BMET  Recent Labs  08/22/15 1130 08/24/15 0545  NA 138 137  K 3.9 3.9  CL 100* 101  CO2 31 31  GLUCOSE 121* 110*  BUN 8 7  CREATININE 0.96 0.80  CALCIUM 9.0 8.6*    Imaging: No results found.   PE: General: pleasant, WD/WN white male who is laying in bed in NAD HEENT: head is normocephalic, abrasions to right side of face/chin. Sclera are noninjected. PERRL. Ears and nose without any masses or lesions. Mouth is pink and moist Heart: regular, rate, and rhythm. Normal s1,s2. No obvious murmurs, gallops, or rubs noted. Palpable radial and pedal pulses bilaterally Lungs: CTAB, no wheezes, rhonchi, or rales noted. Respiratory effort nonlabored Abd: soft, NT/ND, +BS, no masses, hernias, or organomegaly MS: all 4 extremities edematous, distal CSM intact to all 4. Ex fix on left LE.  Right knee in immobilizer, Lower legs in splints/ace wraps. Skin: warm and dry, multiple abrasions to extremities Psych: A&Ox3 with an appropriate affect.   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 Rolena Infante Left trimalleolar ankle fracture - s/p closed reduction fx/dx with ex fix - Dr. Rolena Infante Left shoulder pain - xray negative, likely soft tissue injury Acute urinary retention - Cude foley in place 08/22/15, start flomax and urecholine, consider voiding trial tomorrow ABL anemia - Hgb 10.8 stable FEN - Tolerating a regular diet, robaxin, ordered Mg citrate DVT proph - SCD's and lovenox Disp - Strict NWB B/L LE. Talked to Dr. Rolena Infante, definitive management by Dr. Doran Durand next week.  Deciding about disposition between surgery.  Patient does not have insurance and may not qualify for [facility].  Need to determine about discharge home vs staying here until next week.   Jomarie Longs, PA-C Pager: [phone] General Trauma PA Pager: [phone]   08/24/2015
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
## 2015-08-25 DIAGNOSIS — S92901A Unspecified fracture of right foot, initial encounter for closed fracture: Secondary | ICD-10-CM | POA: Diagnosis present

## 2015-08-25 DIAGNOSIS — D62 Acute posthemorrhagic anemia: Secondary | ICD-10-CM | POA: Diagnosis not present

## 2015-08-25 DIAGNOSIS — T07XXXA Unspecified multiple injuries, initial encounter: Secondary | ICD-10-CM

## 2015-08-25 DIAGNOSIS — S82001A Unspecified fracture of right patella, initial encounter for closed fracture: Secondary | ICD-10-CM | POA: Diagnosis present

## 2015-08-25 DIAGNOSIS — R338 Other retention of urine: Secondary | ICD-10-CM | POA: Diagnosis not present

## 2015-08-25 DIAGNOSIS — D181 Lymphangioma, any site: Secondary | ICD-10-CM | POA: Insufficient documentation

## 2015-08-25 MED ORDER — OXYCODONE HCL 5 MG PO TABS
5.0000 mg | ORAL_TABLET | ORAL | Status: DC | PRN
  Administered 2015-08-25 (×3): 15 mg via ORAL
  Filled 2015-08-25 (×2): qty 3

## 2015-08-25 MED ORDER — BETHANECHOL CHLORIDE 10 MG PO TABS
10.0000 mg | ORAL_TABLET | ORAL | Status: AC
  Administered 2015-08-25 (×4): 10 mg via ORAL
  Filled 2015-08-25 (×5): qty 1

## 2015-08-25 MED ORDER — BISACODYL 10 MG RE SUPP
10.0000 mg | Freq: Once | RECTAL | Status: AC
  Administered 2015-08-25: 10 mg via RECTAL
  Filled 2015-08-25: qty 1

## 2015-08-25 MED ORDER — DOCUSATE SODIUM 100 MG PO CAPS
200.0000 mg | ORAL_CAPSULE | Freq: Two times a day (BID) | ORAL | Status: DC
Start: 2015-08-25 — End: 2015-08-25
  Administered 2015-08-25: 200 mg via ORAL
  Filled 2015-08-25: qty 2
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
## 2015-08-25 NOTE — Progress Notes (Signed)
OT Cancellation Note  Patient Details Name: Nathaniel Aguilar MRN: [id number] DOB: 07/06/1991   Cancelled Treatment:    Reason Eval/Treat Not Completed: Patient declined, no reason specified. Pt declined therapy due to pain following dressing change. Educated pt on transfer to [facility] from bed. Pt verbalized understanding and declined need to practice. Feels comfortable with ADLs and functional mobility upon d/c home. Will continue to follow pt acutely.   Binnie Kand M.S., OTR/L Pager: [phone]  08/25/2015, 3:22 PM

## 2015-08-25 NOTE — Care Management Note (Signed)
Case Management Note  Patient Details  Name: Nathaniel Aguilar MRN: [id number] Date of Birth: 11-16-1991  Subjective/Objective:  Pt medically stable for dc home today.  He needs further surgery on his leg next week, and will come back to the hospital for it.  He is discharging home with his friend, Nathaniel Aguilar, who has made preparations for him at his home, including building a a ramp for transport in/out of the house.  Discharge address is [address]  [place], [place] [address]  Stacy's cell: [phone], Pt's cell: [phone].                    Action/Plan: Referral to [facility] for [facility] needs, per pt choice.  Start of care 24-48h post dc date.  Referral to [facility] for DME needs.  DME to be delivered to pt room prior to dc home.    Expected Discharge Date:     08/25/15             Expected Discharge Plan:  [place]  In-House Referral:     Discharge planning Services  CM Consult  Post Acute Care Choice:  Home Health Choice offered to:  Patient  DME Arranged:  3-N-1, Wheelchair manual DME Agency:  [place] Arranged:  RN, PT, OT [facility] Agency:  [place]  Status of Service:  Completed, signed off  Medicare Important Message Given:    Date Medicare IM Given:    Medicare IM give by:    Date Additional Medicare IM Given:    Additional Medicare Important Message give by:     If discussed at [place] of Stay Meetings, dates discussed:    Additional Comments:  Reinaldo Raddle, RN, BSN  Trauma/Neuro ICU Case Manager [phone]
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
## 2015-08-25 NOTE — Progress Notes (Signed)
OT Cancellation Note  Patient Details Name: Nathaniel Aguilar MRN: [id number] DOB: 1991-07-04   Cancelled Treatment:    Reason Eval/Treat Not Completed: Other (comment) (Nursing completing dressing change). Will check back as time allows.   Binnie Kand M.S., OTR/L Pager: [phone]  08/25/2015, 2:07 PM

## 2015-08-25 NOTE — Progress Notes (Signed)
Pt and fiance' and other family all instructed regarding all discharge teachings ie BUE dressing changes, LLE pin site care, constipation prevention, pain control, NWB status of BLE, and follow up appts.  Pt and fiance' and mother and father instructed Lovenox administration. All verbalize understanding of teachings and agree to comply. D/C instructions and prescriptions sent with pt. Pt transferred via his wheelchair to vehicle for transport home.

## 2015-08-25 NOTE — Progress Notes (Signed)
    Subjective: 5 Days Post-Op Procedure(s) (LRB): EXTERNAL FIXATION LEG (Left) CLOSED REDUCTION METATARSAL (Right) Patient reports pain as 3 on 0-10 scale.   Denies CP or SOB.  Voiding without difficulty. Positive flatus. Objective: Vital signs in last 24 hours: Temp:  [98.6 F (37 C)-99 F (37.2 C)] 98.6 F (37 C) (10/05 0539) Pulse Rate:  [108-120] 120 (10/05 0539) Resp:  [18] 18 (10/05 0539) BP: (115-140)/(64-78) 140/78 mmHg (10/05 0539) SpO2:  [94 %-99 %] 99 % (10/05 0539)  Intake/Output from previous day: 10/04 0701 - 10/05 0700 In: -  Out: 1200 [Urine:1200] Intake/Output this shift:    Labs:  Recent Labs  08/22/15 1130 08/23/15 0634 08/24/15 0545  HGB 11.7* 10.8* 10.8*    Recent Labs  08/23/15 0634 08/24/15 0545  WBC 6.5 5.9  RBC 3.73* 3.75*  HCT 32.4* 32.2*  PLT 192 215    Recent Labs  08/22/15 1130 08/24/15 0545  NA 138 137  K 3.9 3.9  CL 100* 101  CO2 31 31  BUN 8 7  CREATININE 0.96 0.80  GLUCOSE 121* 110*  CALCIUM 9.0 8.6*   No results for input(s): LABPT, INR in the last 72 hours.  Physical Exam: Neurologically intact Sensation intact distally Incision: no drainage  Ex-fix secure  Assessment/Plan: 5 Days Post-Op Procedure(s) (LRB): EXTERNAL FIXATION LEG (Left) CLOSED REDUCTION METATARSAL (Right) Hold on d/c since patient can't qualify for [facility] services Daily dressing changes Doing well with bed to chair transfers Dr Doran Durand for definitive fx care next week.  Melina Schools D for Dr. Melina Schools [facility] [phone] 08/25/2015, 9:18 AM

## 2015-08-25 NOTE — Discharge Instructions (Signed)
Wash wounds daily in shower with soap and water. Do not soak. Apply antibiotic ointment (i.e. Silvadene) twice daily and as needed to keep moist. Cover with dry dressing.  Keep splints and other orthopedic hardware dry.  No driving.

## 2015-08-25 NOTE — Progress Notes (Signed)
PT Cancellation Note  Patient Details Name: Nathaniel Aguilar MRN: [id number] DOB: 01/30/1991   Cancelled Treatment:    Reason Eval/Treat Not Completed: Patient declined, reporting that he feels confident with mobility at this time. States that he just got off the toilet on his own. Patient reports having all equipment needs met. To D/C home today.   Cassell Clement, PT, CSCS Pager [phone] Office [phone]  08/25/2015, 4:17 PM
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
## 2015-08-25 NOTE — Progress Notes (Signed)
Patient ID: Nathaniel Aguilar, male   DOB: [birth year], 24 y.o.   MRN: [id number]   LOS: 5 days   Subjective: No new c/o.   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 Rolena Infante Left trimalleolar ankle fracture - s/p closed reduction fx/dx with ex fix - Dr. Rolena Infante Acute urinary retention - Voiding trial today ABL anemia - stable FEN - Try suppository for constipation DVT proph - SCD's and lovenox Disp - Strict NWB B/L LE.     Lisette Abu, PA-C Pager: [phone] General Trauma PA Pager: [phone]  08/25/2015

## 2015-08-26 ENCOUNTER — Emergency Department (HOSPITAL_COMMUNITY)
Admission: EM | Admit: 2015-08-26 | Discharge: 2015-08-26 | Disposition: A | Discharge status: Home / Self Care | Payer: BLUE CROSS/BLUE SHIELD | Attending: Emergency Medicine | Admitting: Emergency Medicine

## 2015-08-26 ENCOUNTER — Encounter (HOSPITAL_COMMUNITY): Payer: Self-pay | Admitting: *Deleted

## 2015-08-26 DIAGNOSIS — R509 Fever, unspecified: Secondary | ICD-10-CM

## 2015-08-26 DIAGNOSIS — R339 Retention of urine, unspecified: Secondary | ICD-10-CM | POA: Diagnosis not present

## 2015-08-26 DIAGNOSIS — R3916 Straining to void: Secondary | ICD-10-CM | POA: Insufficient documentation

## 2015-08-26 DIAGNOSIS — Z79899 Other long term (current) drug therapy: Secondary | ICD-10-CM | POA: Insufficient documentation

## 2015-08-26 DIAGNOSIS — Z792 Long term (current) use of antibiotics: Secondary | ICD-10-CM | POA: Diagnosis not present

## 2015-08-26 HISTORY — DX: Fever, unspecified: R50.9

## 2015-08-26 HISTORY — DX: Retention of urine, unspecified: R33.9

## 2015-08-26 LAB — I-STAT CHEM 8, ED
BUN: 14 mg/dL (ref 6–20)
Calcium, Ion: 1.16 mmol/L (ref 1.12–1.23)
Chloride: 95 mmol/L — ABNORMAL LOW (ref 101–111)
Creatinine, Ser: 0.9 mg/dL (ref 0.61–1.24)
Glucose, Bld: 121 mg/dL — ABNORMAL HIGH (ref 65–99)
HEMATOCRIT: 39 % (ref 39.0–52.0)
HEMOGLOBIN: 13.3 g/dL (ref 13.0–17.0)
Potassium: 4.4 mmol/L (ref 3.5–5.1)
SODIUM: 134 mmol/L — AB (ref 135–145)
TCO2: 28 mmol/L (ref 0–100)

## 2015-08-26 LAB — URINALYSIS, ROUTINE W REFLEX MICROSCOPIC
BILIRUBIN URINE: NEGATIVE
Glucose, UA: NEGATIVE mg/dL
Hgb urine dipstick: NEGATIVE
KETONES UR: NEGATIVE mg/dL
Leukocytes, UA: NEGATIVE
NITRITE: NEGATIVE
PROTEIN: NEGATIVE mg/dL
Specific Gravity, Urine: 1.016 (ref 1.005–1.030)
UROBILINOGEN UA: 2 mg/dL — AB (ref 0.0–1.0)
pH: 7 (ref 5.0–8.0)
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
## 2015-08-26 NOTE — Discharge Instructions (Signed)
Acute Urinary Retention, Male Acute urinary retention is the temporary inability to urinate. This is a common problem in older men. As men age their prostates become larger and block the flow of urine from the bladder. This is usually a problem that has come on gradually.  HOME CARE INSTRUCTIONS If you are sent home with a Foley catheter and a drainage system, you will need to discuss the best course of action with your health care provider. While the catheter is in, maintain a good intake of fluids. Keep the drainage bag emptied and lower than your catheter. This is so that contaminated urine will not flow back into your bladder, which could lead to a urinary tract infection. There are two main types of drainage bags. One is a large bag that usually is used at night. It has a good capacity that will allow you to sleep through the night without having to empty it. The second type is called a leg bag. It has a smaller capacity, so it needs to be emptied more frequently. However, the main advantage is that it can be attached by a leg strap and can go underneath your clothing, allowing you the freedom to move about or leave your home. Only take over-the-counter or prescription medicines for pain, discomfort, or fever as directed by your health care provider.  SEEK MEDICAL CARE IF:  You develop a low-grade fever.  You experience spasms or leakage of urine with the spasms. SEEK IMMEDIATE MEDICAL CARE IF:  1. You develop chills or fever. 2. Your catheter stops draining urine. 3. Your catheter falls out. 4. You start to develop increased bleeding that does not respond to rest and increased fluid intake. MAKE SURE YOU: 1. Understand these instructions. 2. Will watch your condition. 3. Will get help right away if you are not doing well or get worse.   This information is not intended to replace advice given to you by your health care provider. Make sure you discuss any questions you have with your  health care provider.   Document Released: 02/12/2001 Document Revised: 03/23/2015 Document Reviewed: 04/17/2013 Elsevier Interactive Patient Education 2016 [place], Adult A Foley catheter is a soft, flexible tube that is placed into the bladder to drain urine. A Foley catheter may be inserted if:  You leak urine or are not able to control when you urinate (urinary incontinence).  You are not able to urinate when you need to (urinary retention).  You had prostate surgery or surgery on the genitals.  You have certain medical conditions, such as multiple sclerosis, dementia, or a spinal cord injury. If you are going home with a Foley catheter in place, follow the instructions below. TAKING CARE OF THE CATHETER 5. Wash your hands with soap and water. 6. Using mild soap and warm water on a clean washcloth:  Clean the area on your body closest to the catheter insertion site using a circular motion, moving away from the catheter. Never wipe toward the catheter because this could sweep bacteria up into the urethra and cause infection.  Remove all traces of soap. Pat the area dry with a clean towel. For males, reposition the foreskin. 7. Attach the catheter to your leg so there is no tension on the catheter. Use adhesive tape or a leg strap. If you are using adhesive tape, remove any sticky residue left behind by the previous tape you used. 8. Keep the drainage bag below the level of  the bladder, but keep it off the floor. 9. Check throughout the day to be sure the catheter is working and urine is draining freely. Make sure the tubing does not become kinked. 10. Do not pull on the catheter or try to remove it. Pulling could damage internal tissues. TAKING CARE OF THE DRAINAGE BAGS You will be given two drainage bags to take home. One is a large overnight drainage bag, and the other is a smaller leg bag that fits underneath clothing. You may wear the overnight bag  at any time, but you should never wear the smaller leg bag at night. Follow the instructions below for how to empty, change, and clean your drainage bags. Emptying the Drainage Bag You must empty your drainage bag when it is  - full or at least 2-3 times a day. 4. Wash your hands with soap and water. 5. Keep the drainage bag below your hips, below the level of your bladder. This stops urine from going back into the tubing and into your bladder. 6. Hold the dirty bag over the toilet or a clean container. 7. Open the pour spout at the bottom of the bag and empty the urine into the toilet or container. Do not let the pour spout touch the toilet, container, or any other surface. Doing so can place bacteria on the bag, which can cause an infection. 8. Clean the pour spout with a gauze pad or cotton ball that has rubbing alcohol on it. 9. Close the pour spout. 10. Attach the bag to your leg with adhesive tape or a leg strap. 11. Wash your hands well. Changing the Drainage Bag Change your drainage bag once a month or sooner if it starts to smell bad or look dirty. Below are steps to follow when changing the drainage bag. 1. Wash your hands with soap and water. 2. Pinch off the rubber catheter so that urine does not spill out. 3. Disconnect the catheter tube from the drainage tube at the connection valve. Do not let the tubes touch any surface. 4. Clean the end of the catheter tube with an alcohol wipe. Use a different alcohol wipe to clean the end of the drainage tube. 5. Connect the catheter tube to the drainage tube of the clean drainage bag. 6. Attach the new bag to the leg with adhesive tape or a leg strap. Avoid attaching the new bag too tightly. 7. Wash your hands well. Cleaning the Drainage Bag 1. Wash your hands with soap and water. 2. Wash the bag in warm, soapy water. 3. Rinse the bag thoroughly with warm water. 4. Fill the bag with a solution of white vinegar and water (1 cup vinegar to  1 qt warm water [.2 L vinegar to 1 L warm water]). Close the bag and soak it for 30 minutes in the solution. 5. Rinse the bag with warm water. 6. Hang the bag to dry with the pour spout open and hanging downward. 7. Store the clean bag (once it is dry) in a clean plastic bag. 8. Wash your hands well. PREVENTING INFECTION  Wash your hands before and after handling your catheter.  Take showers daily and wash the area where the catheter enters your body. Do not take baths. Replace wet leg straps with dry ones, if this applies.  Do not use powders, sprays, or lotions on the genital area. Only use creams, lotions, or ointments as directed by your caregiver.  For females, wipe from front to back after  each bowel movement.  Drink enough fluids to keep your urine clear or pale yellow unless you have a fluid restriction.  Do not let the drainage bag or tubing touch or lie on the floor.  Wear cotton underwear to absorb moisture and to keep your skin drier. SEEK MEDICAL CARE IF:   Your urine is cloudy or smells unusually bad.  Your catheter becomes clogged.  You are not draining urine into the bag or your bladder feels full.  Your catheter starts to leak. SEEK IMMEDIATE MEDICAL CARE IF:   You have pain, swelling, redness, or pus where the catheter enters the body.  You have pain in the abdomen, legs, lower back, or bladder.  You have a fever.  You see blood fill the catheter, or your urine is pink or red.  You have nausea, vomiting, or chills.  Your catheter gets pulled out. MAKE SURE YOU:   Understand these instructions.  Will watch your condition.  Will get help right away if you are not doing well or get worse.   This information is not intended to replace advice given to you by your health care provider. Make sure you discuss any questions you have with your health care provider.   Document Released: 11/06/2005 Document Revised: 03/23/2014 Document Reviewed:  10/28/2012 Elsevier Interactive Patient Education [employer].

## 2015-08-26 NOTE — ED Notes (Signed)
Pt told me that he was unable to take the lovenox last pm as he was not sure how to give it to himself.  Explained lovenox administration to pt and pt gave himself the lovenox shot (his home med which was past due)
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
## 2015-08-26 NOTE — ED Notes (Signed)
Pt does not want a leg bag as he is not going to be able to empty it well.  Gave leg bag in case he changes his mind

## 2015-08-27 ENCOUNTER — Other Ambulatory Visit: Payer: Self-pay | Admitting: Orthopedic Surgery

## 2015-08-27 ENCOUNTER — Other Ambulatory Visit (HOSPITAL_COMMUNITY): Payer: Self-pay | Admitting: Emergency Medicine

## 2015-08-27 ENCOUNTER — Encounter (HOSPITAL_COMMUNITY): Payer: Self-pay | Admitting: *Deleted

## 2015-08-27 DIAGNOSIS — Z978 Presence of other specified devices: Secondary | ICD-10-CM

## 2015-08-27 HISTORY — DX: Presence of other specified devices: Z97.8

## 2015-08-27 MED ORDER — OXYCODONE-ACETAMINOPHEN 7.5-325 MG PO TABS: 1.0000 | ORAL_TABLET | ORAL | Status: DC | PRN

## 2015-08-27 NOTE — Telephone Encounter (Signed)
Wrote rx for oxy 7.5/325 and will leave at ED front desk for pickup.

## 2015-08-31 ENCOUNTER — Encounter (HOSPITAL_COMMUNITY): Payer: Self-pay | Admitting: Certified Registered"

## 2015-08-31 ENCOUNTER — Encounter (HOSPITAL_COMMUNITY)
Admission: RE | Disposition: A | Discharge status: Home / Self Care | Payer: Self-pay | Source: Ambulatory Visit | Attending: Orthopedic Surgery

## 2015-08-31 ENCOUNTER — Inpatient Hospital Stay (HOSPITAL_BASED_OUTPATIENT_CLINIC_OR_DEPARTMENT_OTHER): Payer: BLUE CROSS/BLUE SHIELD | Admitting: Certified Registered"

## 2015-08-31 ENCOUNTER — Ambulatory Visit (HOSPITAL_COMMUNITY)
Admission: RE | Admit: 2015-08-31 | Discharge: 2015-09-01 | Disposition: A | Discharge status: Home / Self Care | Payer: BLUE CROSS/BLUE SHIELD | Source: Ambulatory Visit | Attending: Orthopedic Surgery | Admitting: Orthopedic Surgery

## 2015-08-31 DIAGNOSIS — S92332A Displaced fracture of third metatarsal bone, left foot, initial encounter for closed fracture: Secondary | ICD-10-CM | POA: Diagnosis not present

## 2015-08-31 DIAGNOSIS — S82842A Displaced bimalleolar fracture of left lower leg, initial encounter for closed fracture: Secondary | ICD-10-CM | POA: Diagnosis not present

## 2015-08-31 DIAGNOSIS — S92342A Displaced fracture of fourth metatarsal bone, left foot, initial encounter for closed fracture: Secondary | ICD-10-CM | POA: Diagnosis not present

## 2015-08-31 DIAGNOSIS — S82853A Displaced trimalleolar fracture of unspecified lower leg, initial encounter for closed fracture: Secondary | ICD-10-CM | POA: Diagnosis present

## 2015-08-31 DIAGNOSIS — S92312A Displaced fracture of first metatarsal bone, left foot, initial encounter for closed fracture: Secondary | ICD-10-CM | POA: Diagnosis not present

## 2015-08-31 DIAGNOSIS — S92322A Displaced fracture of second metatarsal bone, left foot, initial encounter for closed fracture: Secondary | ICD-10-CM | POA: Diagnosis not present

## 2015-08-31 DIAGNOSIS — S92352A Displaced fracture of fifth metatarsal bone, left foot, initial encounter for closed fracture: Secondary | ICD-10-CM | POA: Diagnosis not present

## 2015-08-31 HISTORY — PX: ORIF TOE FRACTURE: SHX5032

## 2015-08-31 HISTORY — DX: Carrier of infectious disease, unspecified: Z22.9

## 2015-08-31 HISTORY — DX: Retention of urine, unspecified: R33.9

## 2015-08-31 HISTORY — PX: EXTERNAL FIXATION REMOVAL: SHX5040

## 2015-08-31 HISTORY — DX: Displaced trimalleolar fracture of unspecified lower leg, initial encounter for closed fracture: S82.853A

## 2015-08-31 HISTORY — DX: Fever, unspecified: R50.9

## 2015-08-31 HISTORY — DX: Anemia, unspecified: D64.9

## 2015-08-31 HISTORY — DX: Personal history of other diseases of the respiratory system: Z87.09

## 2015-08-31 HISTORY — DX: Fracture of unspecified metatarsal bone(s), unspecified foot, initial encounter for closed fracture: S92.309A

## 2015-08-31 HISTORY — PX: ORIF ANKLE FRACTURE: SHX5408

## 2015-08-31 HISTORY — DX: Unspecified multiple injuries, initial encounter: T07.XXXA

## 2015-08-31 HISTORY — DX: Unspecified fracture of right patella, initial encounter for closed fracture: S82.001A

## 2015-08-31 HISTORY — DX: Presence of urogenital implants: Z96.0

## 2015-08-31 SURGERY — OPEN REDUCTION INTERNAL FIXATION (ORIF) ANKLE FRACTURE
Anesthesia: Regional | Site: Leg Lower | Laterality: Left

## 2015-08-31 MED ORDER — OXYCODONE HCL 5 MG/5ML PO SOLN
5.0000 mg | Freq: Once | ORAL | Status: AC | PRN
  Administered 2015-09-01: 5 mg via ORAL
  Filled 2015-08-31: qty 5

## 2015-08-31 MED ORDER — ENOXAPARIN SODIUM 40 MG/0.4ML ~~LOC~~ SOLN
40.0000 mg | SUBCUTANEOUS | Status: DC
  Filled 2015-08-31: qty 0.4

## 2015-08-31 MED ORDER — FENTANYL CITRATE (PF) 100 MCG/2ML IJ SOLN
50.0000 ug | INTRAMUSCULAR | Status: AC | PRN
  Administered 2015-08-31: 50 ug via INTRAVENOUS
  Administered 2015-08-31: 100 ug via INTRAVENOUS
  Administered 2015-08-31: 50 ug via INTRAVENOUS

## 2015-08-31 MED ORDER — MIDAZOLAM HCL 2 MG/2ML IJ SOLN
INTRAMUSCULAR | Status: AC
  Filled 2015-08-31: qty 2

## 2015-08-31 MED ORDER — METOCLOPRAMIDE HCL 5 MG/ML IJ SOLN
5.0000 mg | Freq: Three times a day (TID) | INTRAMUSCULAR | Status: DC | PRN
  Filled 2015-08-31: qty 2

## 2015-08-31 MED ORDER — SENNA 8.6 MG PO TABS
1.0000 | ORAL_TABLET | Freq: Two times a day (BID) | ORAL | Status: DC
  Filled 2015-08-31: qty 1

## 2015-08-31 MED ORDER — LIDOCAINE HCL (CARDIAC) 20 MG/ML IV SOLN
INTRAVENOUS | Status: AC
  Filled 2015-08-31: qty 5

## 2015-08-31 MED ORDER — CHLORHEXIDINE GLUCONATE 4 % EX LIQD: 60.0000 mL | Freq: Once | CUTANEOUS | Status: DC

## 2015-08-31 MED ORDER — ACETAMINOPHEN 325 MG PO TABS
650.0000 mg | ORAL_TABLET | Freq: Four times a day (QID) | ORAL | Status: DC | PRN
  Administered 2015-08-31: 650 mg via ORAL
  Filled 2015-08-31 (×3): qty 2

## 2015-08-31 MED ORDER — SENNA 8.6 MG PO TABS
2.0000 | ORAL_TABLET | Freq: Two times a day (BID) | ORAL | Status: DC
  Filled 2015-08-31: qty 2

## 2015-08-31 MED ORDER — DEXAMETHASONE SODIUM PHOSPHATE 10 MG/ML IJ SOLN
INTRAMUSCULAR | Status: AC
  Filled 2015-08-31: qty 1

## 2015-08-31 MED ORDER — OXYCODONE HCL 5 MG PO TABS
5.0000 mg | ORAL_TABLET | ORAL | Status: DC | PRN
  Administered 2015-09-01: 10 mg via ORAL
  Filled 2015-08-31 (×2): qty 2

## 2015-08-31 MED ORDER — BUPIVACAINE-EPINEPHRINE (PF) 0.5% -1:200000 IJ SOLN
INTRAMUSCULAR | Status: DC | PRN
  Administered 2015-08-31 (×2): 10 mL via PERINEURAL

## 2015-08-31 MED ORDER — DOCUSATE SODIUM 100 MG PO CAPS
100.0000 mg | ORAL_CAPSULE | Freq: Two times a day (BID) | ORAL | Status: DC
  Filled 2015-08-31: qty 1

## 2015-08-31 MED ORDER — ASPIRIN EC 325 MG PO TBEC
325.0000 mg | DELAYED_RELEASE_TABLET | Freq: Every day | ORAL | Status: DC
  Filled 2015-08-31: qty 1

## 2015-08-31 MED ORDER — MIDAZOLAM HCL 2 MG/2ML IJ SOLN
INTRAMUSCULAR | Status: AC
  Filled 2015-08-31: qty 4

## 2015-08-31 MED ORDER — SODIUM CHLORIDE 0.9 % IV SOLN
INTRAVENOUS | Status: DC
  Administered 2015-08-31: 19:00:00 via INTRAVENOUS

## 2015-08-31 MED ORDER — ROPIVACAINE HCL 5 MG/ML IJ SOLN
INTRAMUSCULAR | Status: DC | PRN
  Administered 2015-08-31 (×2): 10 mL via PERINEURAL

## 2015-08-31 MED ORDER — OXYCODONE HCL 5 MG PO TABS: 5.0000 mg | ORAL_TABLET | Freq: Once | ORAL | Status: AC | PRN

## 2015-08-31 MED ORDER — SCOPOLAMINE 1 MG/3DAYS TD PT72: 1.0000 | MEDICATED_PATCH | Freq: Once | TRANSDERMAL | Status: DC | PRN

## 2015-08-31 MED ORDER — PROPOFOL 10 MG/ML IV BOLUS
INTRAVENOUS | Status: DC | PRN
  Administered 2015-08-31: 300 mg via INTRAVENOUS

## 2015-08-31 MED ORDER — DEXAMETHASONE SODIUM PHOSPHATE 10 MG/ML IJ SOLN
INTRAMUSCULAR | Status: DC | PRN
  Administered 2015-08-31: 10 mg via INTRAVENOUS

## 2015-08-31 MED ORDER — METOCLOPRAMIDE HCL 5 MG PO TABS
5.0000 mg | ORAL_TABLET | Freq: Three times a day (TID) | ORAL | Status: DC | PRN
  Filled 2015-08-31: qty 2

## 2015-08-31 MED ORDER — ONDANSETRON HCL 4 MG/2ML IJ SOLN
4.0000 mg | Freq: Four times a day (QID) | INTRAMUSCULAR | Status: DC | PRN
  Filled 2015-08-31: qty 2

## 2015-08-31 MED ORDER — GLYCOPYRROLATE 0.2 MG/ML IJ SOLN: 0.2000 mg | Freq: Once | INTRAMUSCULAR | Status: DC | PRN

## 2015-08-31 MED ORDER — ONDANSETRON HCL 4 MG PO TABS
4.0000 mg | ORAL_TABLET | Freq: Four times a day (QID) | ORAL | Status: DC | PRN
  Filled 2015-08-31: qty 1

## 2015-08-31 MED ORDER — ONDANSETRON HCL 4 MG/2ML IJ SOLN
INTRAMUSCULAR | Status: DC | PRN
  Administered 2015-08-31: 4 mg via INTRAVENOUS

## 2015-08-31 MED ORDER — CEFAZOLIN SODIUM-DEXTROSE 2-3 GM-% IV SOLR
INTRAVENOUS | Status: AC
  Filled 2015-08-31: qty 50

## 2015-08-31 MED ORDER — LACTATED RINGERS IV SOLN
INTRAVENOUS | Status: DC
  Administered 2015-08-31 (×2): via INTRAVENOUS

## 2015-08-31 MED ORDER — FENTANYL CITRATE (PF) 100 MCG/2ML IJ SOLN
INTRAMUSCULAR | Status: AC
  Filled 2015-08-31: qty 2

## 2015-08-31 MED ORDER — ONDANSETRON HCL 4 MG/2ML IJ SOLN
INTRAMUSCULAR | Status: AC
  Filled 2015-08-31: qty 2

## 2015-08-31 MED ORDER — OXYCODONE HCL 5 MG PO TABS
5.0000 mg | ORAL_TABLET | ORAL | Status: DC | PRN
Start: 2015-08-31 — End: 2015-09-01
  Administered 2015-08-31 – 2015-09-01 (×3): 10 mg via ORAL
  Filled 2015-08-31 (×6): qty 2

## 2015-08-31 MED ORDER — FENTANYL CITRATE (PF) 100 MCG/2ML IJ SOLN
INTRAMUSCULAR | Status: AC
  Filled 2015-08-31: qty 4

## 2015-08-31 MED ORDER — SODIUM CHLORIDE 0.9 % IV SOLN: INTRAVENOUS | Status: DC

## 2015-08-31 MED ORDER — CEFAZOLIN SODIUM-DEXTROSE 2-3 GM-% IV SOLR
2.0000 g | INTRAVENOUS | Status: AC
  Administered 2015-08-31: 2 g via INTRAVENOUS

## 2015-08-31 MED ORDER — HYDROMORPHONE HCL 1 MG/ML IJ SOLN
0.2500 mg | INTRAMUSCULAR | Status: DC | PRN
  Administered 2015-09-01 (×4): 0.5 mg via INTRAVENOUS
  Filled 2015-08-31 (×2): qty 1

## 2015-08-31 MED ORDER — ACETAMINOPHEN 650 MG RE SUPP
650.0000 mg | Freq: Four times a day (QID) | RECTAL | Status: DC | PRN
  Filled 2015-08-31: qty 1

## 2015-08-31 MED ORDER — METHOCARBAMOL 500 MG PO TABS
1000.0000 mg | ORAL_TABLET | Freq: Four times a day (QID) | ORAL | Status: DC | PRN
  Administered 2015-08-31 – 2015-09-01 (×2): 1000 mg via ORAL
  Filled 2015-08-31 (×5): qty 2

## 2015-08-31 MED ORDER — PROPOFOL 500 MG/50ML IV EMUL
INTRAVENOUS | Status: AC
  Filled 2015-08-31: qty 50

## 2015-08-31 MED ORDER — MORPHINE SULFATE (PF) 2 MG/ML IV SOLN
2.0000 mg | INTRAVENOUS | Status: DC | PRN
  Administered 2015-09-01: 2 mg via INTRAVENOUS
  Filled 2015-08-31 (×2): qty 1

## 2015-08-31 MED ORDER — MIDAZOLAM HCL 2 MG/2ML IJ SOLN
1.0000 mg | INTRAMUSCULAR | Status: DC | PRN
  Administered 2015-08-31: 2 mg via INTRAVENOUS
  Administered 2015-08-31 (×2): 1 mg via INTRAVENOUS

## 2015-08-31 MED ORDER — LIDOCAINE HCL (CARDIAC) 20 MG/ML IV SOLN
INTRAVENOUS | Status: DC | PRN
  Administered 2015-08-31: 80 mg via INTRAVENOUS

## 2015-08-31 MED ORDER — MEPERIDINE HCL 25 MG/ML IJ SOLN
6.2500 mg | INTRAMUSCULAR | Status: DC | PRN
Start: 2015-08-31 — End: 2015-09-01

## 2015-08-31 SURGICAL SUPPLY — 95 items
BANDAGE ESMARK 6X9 LF (GAUZE/BANDAGES/DRESSINGS) ×3 IMPLANT
BIT DRILL 2.0 (BIT) ×4
BIT DRILL 2.0MM (BIT) ×1
BIT DRILL 2.5X2.75 QC CALB (BIT) ×2 IMPLANT
BIT DRILL 2.9 CANN QC NONSTRL (BIT) ×2 IMPLANT
BIT DRILL 2XNS DISP SS SM FRAG (BIT) IMPLANT
BIT DRL 2XNS DISP SS SM FRAG (BIT) ×3
BLADE SURG 15 STRL LF DISP TIS (BLADE) ×6 IMPLANT
BLADE SURG 15 STRL SS (BLADE) ×10
BNDG CMPR 9X4 STRL LF SNTH (GAUZE/BANDAGES/DRESSINGS)
BNDG CMPR 9X6 STRL LF SNTH (GAUZE/BANDAGES/DRESSINGS) ×3
BNDG COHESIVE 4X5 TAN STRL (GAUZE/BANDAGES/DRESSINGS) ×5 IMPLANT
BNDG COHESIVE 6X5 TAN STRL LF (GAUZE/BANDAGES/DRESSINGS) ×5 IMPLANT
BNDG CONFORM 2 STRL LF (GAUZE/BANDAGES/DRESSINGS) IMPLANT
BNDG ESMARK 4X9 LF (GAUZE/BANDAGES/DRESSINGS) IMPLANT
BNDG ESMARK 6X9 LF (GAUZE/BANDAGES/DRESSINGS) ×5
CANISTER SUCT 1200ML W/VALVE (MISCELLANEOUS) ×5 IMPLANT
CHLORAPREP W/TINT 26ML (MISCELLANEOUS) ×5 IMPLANT
COVER BACK TABLE 60X90IN (DRAPES) ×5 IMPLANT
CUFF TOURNIQUET SINGLE 34IN LL (TOURNIQUET CUFF) ×5 IMPLANT
DECANTER SPIKE VIAL GLASS SM (MISCELLANEOUS) IMPLANT
DRAPE EXTREMITY T 121X128X90 (DRAPE) ×5 IMPLANT
DRAPE OEC MINIVIEW 54X84 (DRAPES) ×7 IMPLANT
DRAPE U-SHAPE 47X51 STRL (DRAPES) ×5 IMPLANT
DRSG MEPITEL 4X7.2 (GAUZE/BANDAGES/DRESSINGS) ×5 IMPLANT
DRSG PAD ABDOMINAL 8X10 ST (GAUZE/BANDAGES/DRESSINGS) ×10 IMPLANT
ELECT REM PT RETURN 9FT ADLT (ELECTROSURGICAL) ×5
ELECTRODE REM PT RTRN 9FT ADLT (ELECTROSURGICAL) ×3 IMPLANT
GAUZE SPONGE 4X4 12PLY STRL (GAUZE/BANDAGES/DRESSINGS) ×5 IMPLANT
GLOVE BIO SURGEON STRL SZ 6.5 (GLOVE) ×1 IMPLANT
GLOVE BIO SURGEON STRL SZ8 (GLOVE) ×7 IMPLANT
GLOVE BIO SURGEONS STRL SZ 6.5 (GLOVE) ×1
GLOVE BIOGEL PI IND STRL 7.0 (GLOVE) IMPLANT
GLOVE BIOGEL PI IND STRL 8 (GLOVE) ×6 IMPLANT
GLOVE BIOGEL PI INDICATOR 7.0 (GLOVE) ×4
GLOVE BIOGEL PI INDICATOR 8 (GLOVE) ×4
GLOVE ECLIPSE 6.5 STRL STRAW (GLOVE) ×2 IMPLANT
GLOVE ECLIPSE 7.5 STRL STRAW (GLOVE) ×5 IMPLANT
GLOVE EXAM NITRILE MD LF STRL (GLOVE) IMPLANT
GOWN STRL REUS W/ TWL LRG LVL3 (GOWN DISPOSABLE) ×3 IMPLANT
GOWN STRL REUS W/ TWL XL LVL3 (GOWN DISPOSABLE) ×6 IMPLANT
GOWN STRL REUS W/TWL LRG LVL3 (GOWN DISPOSABLE) ×5
GOWN STRL REUS W/TWL XL LVL3 (GOWN DISPOSABLE) ×10
K-WIRE ACE 1.6X6 (WIRE) ×20
KWIRE ACE 1.6X6 (WIRE) IMPLANT
NEEDLE HYPO 22GX1.5 SAFETY (NEEDLE) IMPLANT
NS IRRIG 1000ML POUR BTL (IV SOLUTION) ×5 IMPLANT
PACK BASIN DAY SURGERY FS (CUSTOM PROCEDURE TRAY) ×5 IMPLANT
PAD CAST 4YDX4 CTTN HI CHSV (CAST SUPPLIES) ×3 IMPLANT
PADDING CAST ABS 4INX4YD NS (CAST SUPPLIES)
PADDING CAST ABS COTTON 4X4 ST (CAST SUPPLIES) IMPLANT
PADDING CAST COTTON 4X4 STRL (CAST SUPPLIES) ×5
PADDING CAST COTTON 6X4 STRL (CAST SUPPLIES) ×5 IMPLANT
PENCIL BUTTON HOLSTER BLD 10FT (ELECTRODE) ×5 IMPLANT
PLATE LOCK 8H 103 BILAT FIB (Plate) ×2 IMPLANT
PLATE SM 1/4 TUBULAR 6H (Plate) ×2 IMPLANT
PLATE SM 1/4 TUBULAR 8H (Plate) ×2 IMPLANT
PUTTY DBM STAGRAFT 2CC (Putty) ×2 IMPLANT
SANITIZER HAND PURELL 535ML FO (MISCELLANEOUS) ×5 IMPLANT
SCREW ACE CAN 4.0 40M (Screw) ×4 IMPLANT
SCREW CORTICAL 2.7 MM 22MM (Screw) ×4 IMPLANT
SCREW CORTICAL 2.7MM  20MM (Screw) ×4 IMPLANT
SCREW CORTICAL 2.7MM  26MM (Screw) ×2 IMPLANT
SCREW CORTICAL 2.7MM  30MM (Screw) ×2 IMPLANT
SCREW CORTICAL 2.7MM  32MM (Screw) ×2 IMPLANT
SCREW CORTICAL 2.7MM 16MM (Screw) ×4 IMPLANT
SCREW CORTICAL 2.7MM 20MM (Screw) IMPLANT
SCREW CORTICAL 2.7MM 26MM (Screw) IMPLANT
SCREW CORTICAL 2.7MM 30MM (Screw) IMPLANT
SCREW CORTICAL 2.7MM 32MM (Screw) IMPLANT
SCREW CORTICAL LOW PROF 3.5X20 (Screw) ×6 IMPLANT
SCREW LOCK 3.5X12 DIST TIB (Screw) ×2 IMPLANT
SCREW LOCK CORT STAR 3.5X14 (Screw) ×2 IMPLANT
SCREW LOW PROFILE 18MMX3.5MM (Screw) ×2 IMPLANT
SHEET MEDIUM DRAPE 40X70 STRL (DRAPES) ×5 IMPLANT
SLEEVE SCD COMPRESS KNEE MED (MISCELLANEOUS) ×3 IMPLANT
SPLINT FAST PLASTER 5X30 (CAST SUPPLIES) ×40
SPLINT PLASTER CAST FAST 5X30 (CAST SUPPLIES) ×60 IMPLANT
SPONGE LAP 18X18 X RAY DECT (DISPOSABLE) ×7 IMPLANT
STOCKINETTE 6  STRL (DRAPES) ×2
STOCKINETTE 6 STRL (DRAPES) ×3 IMPLANT
SUCTION FRAZIER TIP 10 FR DISP (SUCTIONS) ×7 IMPLANT
SUT ETHILON 3 0 PS 1 (SUTURE) ×9 IMPLANT
SUT FIBERWIRE #2 38 T-5 BLUE (SUTURE)
SUT MNCRL AB 3-0 PS2 18 (SUTURE) ×9 IMPLANT
SUT VIC AB 0 SH 27 (SUTURE) IMPLANT
SUT VIC AB 2-0 SH 27 (SUTURE) ×15
SUT VIC AB 2-0 SH 27XBRD (SUTURE) IMPLANT
SUTURE FIBERWR #2 38 T-5 BLUE (SUTURE) IMPLANT
SYR BULB 3OZ (MISCELLANEOUS) ×5 IMPLANT
SYR CONTROL 10ML LL (SYRINGE) IMPLANT
TOWEL OR 17X24 6PK STRL BLUE (TOWEL DISPOSABLE) ×10 IMPLANT
TUBE CONNECTING 20'X1/4 (TUBING) ×1
TUBE CONNECTING 20X1/4 (TUBING) ×4 IMPLANT
UNDERPAD 30X30 (UNDERPADS AND DIAPERS) ×5 IMPLANT

## 2015-08-31 NOTE — Transfer of Care (Signed)
Immediate Anesthesia Transfer of Care Note  Patient: Nathaniel Aguilar  Procedure(s) Performed: Procedure(s): OPEN REDUCTION INTERNAL FIXATION (ORIF) LEFT ANKLE BIMALLEOLAR  FRACTURE,  REMOVAL OF EXTERNAL FIXATION  (Left) OPEN REDUCTION INTERNAL FIXATION (ORIF) LEFT FOOT first and fifthMETATARSAL FRACTURE (Left) REMOVAL EXTERNAL FIXATION LEG (Left)  Patient Location: PACU  Anesthesia Type:GA combined with regional for post-op pain  Level of Consciousness: sedated  Airway & Oxygen Therapy: Patient Spontanous Breathing and Patient connected to face mask oxygen  Post-op Assessment: Report given to RN and Post -op Vital signs reviewed and stable  Post vital signs: Reviewed and stable  Last Vitals:  Filed Vitals:   08/31/15 1334  BP:   Pulse: 79  Temp:   Resp: 12    Complications: No apparent anesthesia complications

## 2015-08-31 NOTE — Discharge Instructions (Signed)
[name], MD °[facility] ° °Please read the following information regarding your care after surgery. ° °Medications  °You only need a prescription for the narcotic pain medicine (ex. oxycodone, Percocet, Norco).  All of the other medicines listed below are available over the counter. °X acetominophen (Tylenol) 650 mg every 4-6 hours as you need for minor pain °X oxycodone as prescribed for moderate to severe pain °?  ° °Narcotic pain medicine (ex. oxycodone, Percocet, Vicodin) will cause constipation.  To prevent this problem, take the following medicines while you are taking any pain medicine. °X docusate sodium (Colace) 100 mg twice a day X senna (Senokot) 2 tablets twice a day ° °X To help prevent blood clots, take an aspirin (325 mg) once a day for a month after surgery.  You should also get up every hour while you are awake to move around.   ° °Weight Bearing °X Do not bear any weight on the operated leg or foot. ° °Cast / Splint / Dressing °X Keep your splint or cast clean and dry.  Don’t put anything (coat hanger, pencil, etc) down inside of it.  If it gets damp, use a hair dryer on the cool setting to dry it.  If it gets soaked, call the office to schedule an appointment for a cast change. °  ° °After your dressing, cast or splint is removed; you may shower, but do not soak or scrub the wound.  Allow the water to run over it, and then gently pat it dry. ° °Swelling °It is normal for you to have swelling where you had surgery.  To reduce swelling and pain, keep your toes above your nose for at least 3 days after surgery.  It may be necessary to keep your foot or leg elevated for several weeks.  If it hurts, it should be elevated. ° °Follow Up °Call my office at [phone] when you are discharged from the hospital or surgery center to schedule an appointment to be seen two weeks after surgery. ° °Call my office at [phone] if you develop a fever >101.5° F, nausea, vomiting, bleeding from  the surgical site or severe pain.   ° ° °

## 2015-08-31 NOTE — Anesthesia Procedure Notes (Addendum)
Anesthesia Regional Block:  Popliteal block  Pre-Anesthetic Checklist: ,, timeout performed, Correct Patient, Correct Site, Correct Laterality, Correct Procedure, Correct Position, site marked, Risks and benefits discussed,  Surgical consent,  Pre-op evaluation,  At surgeon's request and post-op pain management  Laterality: Left and Lower  Prep: chloraprep       Needles:  Injection technique: Single-shot  Needle Type: Echogenic Needle     Needle Length: 9cm 9 cm Needle Gauge: 21 and 21 G    Additional Needles:  Procedures: ultrasound guided (picture in chart) Popliteal block Narrative:  Start time: 08/31/2015 12:58 PM End time: 08/31/2015 1:03 PM Injection made incrementally with aspirations every 5 mL.  Performed by: Personally  Anesthesiologist: CREWS, DAVID   Anesthesia Regional Block:  Adductor canal block  Pre-Anesthetic Checklist: ,, timeout performed, Correct Patient, Correct Site, Correct Laterality, Correct Procedure, Correct Position, site marked, Risks and benefits discussed,  Surgical consent,  Pre-op evaluation,  At surgeon's request and post-op pain management  Laterality: Left and Lower  Prep: chloraprep       Needles:  Injection technique: Single-shot  Needle Type: Echogenic Needle     Needle Length: 9cm 9 cm Needle Gauge: 21 and 21 G    Additional Needles:  Procedures: ultrasound guided (picture in chart) Adductor canal block Narrative:  Start time: 08/31/2015 1:03 PM End time: 08/31/2015 1:08 PM Injection made incrementally with aspirations every 5 mL.  Performed by: Personally  Anesthesiologist: CREWS, DAVID   Procedure Name: LMA Insertion Date/Time: 08/31/2015 1:59 PM Performed by: Baxter Flattery Pre-anesthesia Checklist: Patient identified, Emergency Drugs available, Suction available and Patient being monitored Patient Re-evaluated:Patient Re-evaluated prior to inductionOxygen Delivery Method: Circle System  Utilized Preoxygenation: Pre-oxygenation with 100% oxygen Intubation Type: IV induction Ventilation: Mask ventilation without difficulty LMA: LMA inserted LMA Size: 5.0 Number of attempts: 1 Airway Equipment and Method: Bite block Placement Confirmation: positive ETCO2 and breath sounds checked- equal and bilateral Tube secured with: Tape Dental Injury: Teeth and Oropharynx as per pre-operative assessment

## 2015-08-31 NOTE — Anesthesia Preprocedure Evaluation (Signed)
Anesthesia Evaluation  Patient identified by MRN, date of birth, ID band Patient awake    Reviewed: Allergy & Precautions, NPO status , Patient's Chart, lab work & pertinent test results  Airway Mallampati: I  TM Distance: >3 FB Neck ROM: Full    Dental  (+) Teeth Intact, Dental Advisory Given   Pulmonary    breath sounds clear to auscultation       Cardiovascular  Rhythm:Regular Rate:Normal     Neuro/Psych    GI/Hepatic   Endo/Other    Renal/GU      Musculoskeletal   Abdominal   Peds  Hematology   Anesthesia Other Findings   Reproductive/Obstetrics                            Anesthesia Physical Anesthesia Plan  ASA: I  Anesthesia Plan: General and Regional   Post-op Pain Management:    Induction: Intravenous  Airway Management Planned: LMA  Additional Equipment:   Intra-op Plan:   Post-operative Plan: Extubation in OR  Informed Consent: I have reviewed the patients History and Physical, chart, labs and discussed the procedure including the risks, benefits and alternatives for the proposed anesthesia with the patient or authorized representative who has indicated his/her understanding and acceptance.   Dental advisory given  Plan Discussed with: CRNA, Anesthesiologist and Surgeon  Anesthesia Plan Comments:         Anesthesia Quick Evaluation  

## 2015-08-31 NOTE — Brief Op Note (Signed)
08/31/2015  5:20 PM  PATIENT:  Nathaniel Aguilar  24 y.o. male  PRE-OPERATIVE DIAGNOSIS: 1.  Left ankle bimal fracture s/p closed reduction and external fixation      2.  Left 1-5 metatarsal fractures  POST-OPERATIVE DIAGNOSIS:  same  Procedure(s): 1.  Removal of external fixator from left lower extremity under anesthesia 2.  OPEN REDUCTION INTERNAL FIXATION (ORIF) LEFT ANKLE BIMALLEOLAR  FRACTURE 3.  OPEN REDUCTION INTERNAL FIXATION (ORIF) LEFT FOOT first and fifth METATARSAL FRACTUREs through separate incisions 4.  Closed reduction and percutaneous pinning of 2nd and 3rd metatarsal fractures 5.  Closed treatment of left 4th metatarsal fracture 6.  AP, oblique and lateral xrays of the left foot 7.  AP, mortise and lateral xrays of the left ankle  SURGEON:  Wylene Simmer, MD  ASSISTANT: Mechele Claude, PA-C  ANESTHESIA:   General, regional  EBL:  minimal   TOURNIQUET:   Total Tourniquet Time Documented: Thigh (Left) - 69 minutes Total: Thigh (Left) - 69 minutes at the left thigh 61 min at the left ankle   COMPLICATIONS:  None apparent  DISPOSITION:  Extubated, awake and stable to recovery.  DICTATION ID:  [id number]
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
## 2015-08-31 NOTE — Anesthesia Postprocedure Evaluation (Signed)
  Anesthesia Post-op Note  Patient: Nathaniel Aguilar  Procedure(s) Performed: Procedure(s): OPEN REDUCTION INTERNAL FIXATION (ORIF) LEFT ANKLE BIMALLEOLAR  FRACTURE,  REMOVAL OF EXTERNAL FIXATION  (Left) OPEN REDUCTION INTERNAL FIXATION (ORIF) LEFT FOOT first and fifthMETATARSAL FRACTURE (Left) REMOVAL EXTERNAL FIXATION LEG (Left)  Patient Location: PACU  Anesthesia Type: General, Regional   Level of Consciousness: awake, alert  and oriented  Airway and Oxygen Therapy: Patient Spontanous Breathing  Post-op Pain: none  Post-op Assessment: Post-op Vital signs reviewed  Post-op Vital Signs: Reviewed  Last Vitals:  Filed Vitals:   08/31/15 1800  BP: 129/74  Pulse: 91  Temp:   Resp: 13    Complications: No apparent anesthesia complications

## 2015-08-31 NOTE — Progress Notes (Signed)
Assisted Dr. Crews with left, ultrasound guided, popliteal/saphenous block. Side rails up, monitors on throughout procedure. See vital signs in flow sheet. Tolerated Procedure well. 

## 2015-09-01 ENCOUNTER — Encounter (HOSPITAL_BASED_OUTPATIENT_CLINIC_OR_DEPARTMENT_OTHER): Payer: Self-pay | Admitting: *Deleted

## 2015-09-01 ENCOUNTER — Other Ambulatory Visit: Payer: Self-pay | Admitting: Orthopedic Surgery

## 2015-09-01 DIAGNOSIS — S82842A Displaced bimalleolar fracture of left lower leg, initial encounter for closed fracture: Secondary | ICD-10-CM | POA: Diagnosis not present

## 2015-09-01 MED ORDER — KETOROLAC TROMETHAMINE 30 MG/ML IJ SOLN
30.0000 mg | Freq: Once | INTRAMUSCULAR | Status: AC
  Administered 2015-09-01: 30 mg via INTRAVENOUS
  Filled 2015-09-01: qty 1

## 2015-09-01 MED ORDER — KETOROLAC TROMETHAMINE 30 MG/ML IJ SOLN
INTRAMUSCULAR | Status: AC
  Filled 2015-09-01: qty 1
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
## 2015-09-02 ENCOUNTER — Encounter (HOSPITAL_BASED_OUTPATIENT_CLINIC_OR_DEPARTMENT_OTHER): Payer: Self-pay | Admitting: Orthopedic Surgery

## 2015-09-08 ENCOUNTER — Encounter (HOSPITAL_COMMUNITY): Payer: Self-pay | Admitting: *Deleted

## 2015-09-08 NOTE — Progress Notes (Signed)
Pt is being treated for an UTI with Cipro. Instructed pt to call Dr. Nona Dell office to find out if he needs to stop his Aspirin.

## 2015-09-11 ENCOUNTER — Encounter (HOSPITAL_COMMUNITY)
Admission: RE | Disposition: A | Discharge status: Home / Self Care | Payer: Self-pay | Source: Ambulatory Visit | Attending: Orthopedic Surgery

## 2015-09-11 ENCOUNTER — Ambulatory Visit (HOSPITAL_COMMUNITY): Payer: BLUE CROSS/BLUE SHIELD | Admitting: Certified Registered Nurse Anesthetist

## 2015-09-11 ENCOUNTER — Ambulatory Visit (HOSPITAL_COMMUNITY)
Admission: RE | Admit: 2015-09-11 | Discharge: 2015-09-11 | Disposition: A | Discharge status: Home / Self Care | Payer: BLUE CROSS/BLUE SHIELD | Source: Ambulatory Visit | Attending: Orthopedic Surgery | Admitting: Orthopedic Surgery

## 2015-09-11 ENCOUNTER — Encounter (HOSPITAL_COMMUNITY): Payer: Self-pay | Admitting: *Deleted

## 2015-09-11 DIAGNOSIS — S92341A Displaced fracture of fourth metatarsal bone, right foot, initial encounter for closed fracture: Secondary | ICD-10-CM | POA: Insufficient documentation

## 2015-09-11 DIAGNOSIS — S92321A Displaced fracture of second metatarsal bone, right foot, initial encounter for closed fracture: Secondary | ICD-10-CM | POA: Insufficient documentation

## 2015-09-11 DIAGNOSIS — S92311A Displaced fracture of first metatarsal bone, right foot, initial encounter for closed fracture: Secondary | ICD-10-CM | POA: Insufficient documentation

## 2015-09-11 DIAGNOSIS — S93334A Other dislocation of right foot, initial encounter: Secondary | ICD-10-CM | POA: Insufficient documentation

## 2015-09-11 DIAGNOSIS — S92351A Displaced fracture of fifth metatarsal bone, right foot, initial encounter for closed fracture: Secondary | ICD-10-CM | POA: Diagnosis not present

## 2015-09-11 HISTORY — DX: Headache: R51

## 2015-09-11 HISTORY — DX: Headache, unspecified: R51.9

## 2015-09-11 HISTORY — DX: Unspecified asthma, uncomplicated: J45.909

## 2015-09-11 HISTORY — DX: Pneumonia, unspecified organism: J18.9

## 2015-09-11 HISTORY — PX: OPEN REDUCTION INTERNAL FIXATION (ORIF) FOOT LISFRANC FRACTURE: SHX5990

## 2015-09-11 HISTORY — DX: Urinary tract infection, site not specified: N39.0

## 2015-09-11 SURGERY — OPEN REDUCTION INTERNAL FIXATION (ORIF) FOOT LISFRANC FRACTURE
Anesthesia: General | Site: Foot | Laterality: Right

## 2015-09-11 MED ORDER — DIAZEPAM 5 MG PO TABS
5.0000 mg | ORAL_TABLET | Freq: Once | ORAL | Status: AC
  Administered 2015-09-11: 5 mg via ORAL

## 2015-09-11 MED ORDER — SODIUM CHLORIDE 0.9 % IJ SOLN
INTRAMUSCULAR | Status: AC
  Filled 2015-09-11: qty 10

## 2015-09-11 MED ORDER — SUCCINYLCHOLINE CHLORIDE 20 MG/ML IJ SOLN
INTRAMUSCULAR | Status: AC
  Filled 2015-09-11: qty 1

## 2015-09-11 MED ORDER — PROPOFOL 10 MG/ML IV BOLUS
INTRAVENOUS | Status: AC
  Filled 2015-09-11: qty 20

## 2015-09-11 MED ORDER — PHENYLEPHRINE HCL 10 MG/ML IJ SOLN
INTRAMUSCULAR | Status: DC | PRN
  Administered 2015-09-11: 80 ug via INTRAVENOUS
  Administered 2015-09-11: 40 ug via INTRAVENOUS

## 2015-09-11 MED ORDER — LACTATED RINGERS IV SOLN
INTRAVENOUS | Status: DC
  Administered 2015-09-11: 07:00:00 via INTRAVENOUS

## 2015-09-11 MED ORDER — SODIUM CHLORIDE 0.9 % IV SOLN: INTRAVENOUS | Status: DC

## 2015-09-11 MED ORDER — ONDANSETRON HCL 4 MG/2ML IJ SOLN
INTRAMUSCULAR | Status: AC
  Filled 2015-09-11: qty 2

## 2015-09-11 MED ORDER — HYDROMORPHONE HCL 1 MG/ML IJ SOLN: 0.2500 mg | INTRAMUSCULAR | Status: DC | PRN

## 2015-09-11 MED ORDER — OXYCODONE HCL 5 MG/5ML PO SOLN: 5.0000 mg | Freq: Once | ORAL | Status: DC | PRN

## 2015-09-11 MED ORDER — EPHEDRINE SULFATE 50 MG/ML IJ SOLN
INTRAMUSCULAR | Status: AC
  Filled 2015-09-11: qty 1

## 2015-09-11 MED ORDER — 0.9 % SODIUM CHLORIDE (POUR BTL) OPTIME
TOPICAL | Status: DC | PRN
  Administered 2015-09-11: 1000 mL

## 2015-09-11 MED ORDER — BACITRACIN ZINC 500 UNIT/GM EX OINT
TOPICAL_OINTMENT | CUTANEOUS | Status: AC
  Filled 2015-09-11: qty 28.35

## 2015-09-11 MED ORDER — MIDAZOLAM HCL 5 MG/5ML IJ SOLN
INTRAMUSCULAR | Status: DC | PRN
  Administered 2015-09-11: 2 mg via INTRAVENOUS

## 2015-09-11 MED ORDER — ONDANSETRON HCL 4 MG/2ML IJ SOLN: 4.0000 mg | Freq: Once | INTRAMUSCULAR | Status: DC | PRN

## 2015-09-11 MED ORDER — CHLORHEXIDINE GLUCONATE 4 % EX LIQD: 60.0000 mL | Freq: Once | CUTANEOUS | Status: DC

## 2015-09-11 MED ORDER — OXYCODONE HCL 5 MG PO TABS: 5.0000 mg | ORAL_TABLET | Freq: Once | ORAL | Status: DC | PRN

## 2015-09-11 MED ORDER — MIDAZOLAM HCL 2 MG/2ML IJ SOLN
INTRAMUSCULAR | Status: AC
  Filled 2015-09-11: qty 4

## 2015-09-11 MED ORDER — ONDANSETRON HCL 4 MG/2ML IJ SOLN
INTRAMUSCULAR | Status: DC | PRN
  Administered 2015-09-11: 4 mg via INTRAVENOUS

## 2015-09-11 MED ORDER — FENTANYL CITRATE (PF) 100 MCG/2ML IJ SOLN
INTRAMUSCULAR | Status: DC | PRN
  Administered 2015-09-11 (×2): 50 ug via INTRAVENOUS

## 2015-09-11 MED ORDER — HYDROMORPHONE HCL 2 MG PO TABS
4.0000 mg | ORAL_TABLET | Freq: Once | ORAL | Status: AC
  Administered 2015-09-11: 4 mg via ORAL

## 2015-09-11 MED ORDER — PROPOFOL 10 MG/ML IV BOLUS
INTRAVENOUS | Status: DC | PRN
  Administered 2015-09-11: 200 mg via INTRAVENOUS

## 2015-09-11 MED ORDER — HYDROMORPHONE HCL 2 MG PO TABS
ORAL_TABLET | ORAL | Status: AC
  Administered 2015-09-11: 4 mg via ORAL
  Filled 2015-09-11: qty 2

## 2015-09-11 MED ORDER — DIAZEPAM 5 MG PO TABS: 5.0000 mg | ORAL_TABLET | Freq: Three times a day (TID) | ORAL | Status: DC | PRN

## 2015-09-11 MED ORDER — DIAZEPAM 5 MG PO TABS
ORAL_TABLET | ORAL | Status: AC
  Filled 2015-09-11: qty 1

## 2015-09-11 MED ORDER — LIDOCAINE HCL (CARDIAC) 20 MG/ML IV SOLN
INTRAVENOUS | Status: AC
  Filled 2015-09-11: qty 5

## 2015-09-11 MED ORDER — FENTANYL CITRATE (PF) 250 MCG/5ML IJ SOLN
INTRAMUSCULAR | Status: AC
  Filled 2015-09-11: qty 5

## 2015-09-11 MED ORDER — LIDOCAINE HCL (CARDIAC) 20 MG/ML IV SOLN
INTRAVENOUS | Status: DC | PRN
  Administered 2015-09-11: 20 mg via INTRAVENOUS

## 2015-09-11 MED ORDER — BUPIVACAINE-EPINEPHRINE (PF) 0.5% -1:200000 IJ SOLN
INTRAMUSCULAR | Status: AC
  Filled 2015-09-11: qty 30

## 2015-09-11 MED ORDER — PHENYLEPHRINE 40 MCG/ML (10ML) SYRINGE FOR IV PUSH (FOR BLOOD PRESSURE SUPPORT)
PREFILLED_SYRINGE | INTRAVENOUS | Status: AC
  Filled 2015-09-11: qty 10

## 2015-09-11 MED ORDER — ROCURONIUM BROMIDE 50 MG/5ML IV SOLN
INTRAVENOUS | Status: AC
  Filled 2015-09-11: qty 1

## 2015-09-11 MED ORDER — CEFAZOLIN SODIUM-DEXTROSE 2-3 GM-% IV SOLR
2.0000 g | INTRAVENOUS | Status: AC
  Administered 2015-09-11: 2 g via INTRAVENOUS
  Filled 2015-09-11: qty 50

## 2015-09-11 MED ORDER — HYDROMORPHONE HCL 4 MG PO TABS: 4.0000 mg | ORAL_TABLET | ORAL | Status: DC | PRN

## 2015-09-11 SURGICAL SUPPLY — 81 items
BANDAGE ESMARK 6X9 LF (GAUZE/BANDAGES/DRESSINGS) ×1 IMPLANT
BIT DRILL 2.0 (BIT) ×2
BIT DRILL 2.0MM (BIT) ×1
BIT DRILL 2.5X2.75 QC CALB (BIT) ×2 IMPLANT
BIT DRILL 2XNS DISP SS SM FRAG (BIT) IMPLANT
BIT DRILL 3.5X5.5 QC CALB (BIT) ×2 IMPLANT
BIT DRILL CALIBRATED 2.7 (BIT) ×1 IMPLANT
BIT DRILL CALIBRATED 2.7MM (BIT) ×1
BIT DRL 2XNS DISP SS SM FRAG (BIT) ×1
BLADE SURG 15 STRL LF DISP TIS (BLADE) ×1 IMPLANT
BLADE SURG 15 STRL SS (BLADE) ×3
BNDG CMPR 9X6 STRL LF SNTH (GAUZE/BANDAGES/DRESSINGS) ×1
BNDG COHESIVE 4X5 TAN STRL (GAUZE/BANDAGES/DRESSINGS) ×3 IMPLANT
BNDG COHESIVE 6X5 TAN STRL LF (GAUZE/BANDAGES/DRESSINGS) ×3 IMPLANT
BNDG ESMARK 6X9 LF (GAUZE/BANDAGES/DRESSINGS) ×3
CANISTER SUCT 3000ML PPV (MISCELLANEOUS) ×3 IMPLANT
CHLORAPREP W/TINT 26ML (MISCELLANEOUS) ×3 IMPLANT
COVER SURGICAL LIGHT HANDLE (MISCELLANEOUS) ×3 IMPLANT
CUFF TOURNIQUET SINGLE 34IN LL (TOURNIQUET CUFF) ×3 IMPLANT
DRAPE OEC MINIVIEW 54X84 (DRAPES) ×3 IMPLANT
DRAPE U-SHAPE 47X51 STRL (DRAPES) ×3 IMPLANT
DRSG ADAPTIC 3X8 NADH LF (GAUZE/BANDAGES/DRESSINGS) IMPLANT
DRSG MEPITEL 4X7.2 (GAUZE/BANDAGES/DRESSINGS) ×2 IMPLANT
DRSG PAD ABDOMINAL 8X10 ST (GAUZE/BANDAGES/DRESSINGS) ×4 IMPLANT
ELECT REM PT RETURN 9FT ADLT (ELECTROSURGICAL) ×3
ELECTRODE REM PT RTRN 9FT ADLT (ELECTROSURGICAL) ×1 IMPLANT
GAUZE SPONGE 4X4 12PLY STRL (GAUZE/BANDAGES/DRESSINGS) IMPLANT
GLOVE BIO SURGEON STRL SZ8 (GLOVE) ×3 IMPLANT
GLOVE BIOGEL PI IND STRL 8 (GLOVE) ×1 IMPLANT
GLOVE BIOGEL PI INDICATOR 8 (GLOVE) ×2
GLOVE ECLIPSE 7.5 STRL STRAW (GLOVE) ×3 IMPLANT
GOWN STRL REUS W/ TWL LRG LVL3 (GOWN DISPOSABLE) ×1 IMPLANT
GOWN STRL REUS W/ TWL XL LVL3 (GOWN DISPOSABLE) ×2 IMPLANT
GOWN STRL REUS W/TWL LRG LVL3 (GOWN DISPOSABLE) ×3
GOWN STRL REUS W/TWL XL LVL3 (GOWN DISPOSABLE) ×6
K-WIRE TROC 1.25X150 (WIRE) ×3
KIT BASIN OR (CUSTOM PROCEDURE TRAY) ×3 IMPLANT
KIT ROOM TURNOVER OR (KITS) ×3 IMPLANT
KWIRE TROC 1.25X150 (WIRE) IMPLANT
NEEDLE 22X1 1/2 (OR ONLY) (NEEDLE) IMPLANT
NS IRRIG 1000ML POUR BTL (IV SOLUTION) ×3 IMPLANT
PACK ORTHO EXTREMITY (CUSTOM PROCEDURE TRAY) ×3 IMPLANT
PAD ARMBOARD 7.5X6 YLW CONV (MISCELLANEOUS) ×6 IMPLANT
PAD CAST 4YDX4 CTTN HI CHSV (CAST SUPPLIES) ×1 IMPLANT
PADDING CAST ABS 4INX4YD NS (CAST SUPPLIES) ×2
PADDING CAST ABS 6INX4YD NS (CAST SUPPLIES) ×2
PADDING CAST ABS COTTON 4X4 ST (CAST SUPPLIES) IMPLANT
PADDING CAST ABS COTTON 6X4 NS (CAST SUPPLIES) IMPLANT
PADDING CAST COTTON 4X4 STRL (CAST SUPPLIES) ×3
PLATE LOCK COMP (Plate) ×2 IMPLANT
SCREW CORT 3.5X26 (Screw) ×3 IMPLANT
SCREW CORT 3.5X40 (Screw) ×2 IMPLANT
SCREW CORT T15 26X3.5XST LCK (Screw) IMPLANT
SCREW CORT T15 30X3.5XST LCK (Screw) IMPLANT
SCREW CORTICAL 2.7MM  26MM (Screw) ×2 IMPLANT
SCREW CORTICAL 2.7MM 26MM (Screw) IMPLANT
SCREW CORTICAL 3.5X30MM (Screw) ×3 IMPLANT
SCREW LOCK CORT STAR 3.5X14 (Screw) ×2 IMPLANT
SCREW LOCK CORT STAR 3.5X20 (Screw) ×2 IMPLANT
SCREW LOCK CORT STAR 3.5X22 (Screw) ×2 IMPLANT
SCREW LOCK CORT STAR 3.5X44 (Screw) ×2 IMPLANT
SCREW LOW PROFILE 3.5MMX42 (Screw) ×4 IMPLANT
SCREW LP 3.5X44 (Screw) ×2 IMPLANT
SCREW NON LOCKING LP 3.5 14MM (Screw) ×2 IMPLANT
SPONGE LAP 18X18 X RAY DECT (DISPOSABLE) ×3 IMPLANT
STAPLER VISISTAT 35W (STAPLE) IMPLANT
SUCTION FRAZIER TIP 10 FR DISP (SUCTIONS) ×3 IMPLANT
SUT ETHILON 3 0 PS 1 (SUTURE) ×2 IMPLANT
SUT MNCRL AB 3-0 PS2 18 (SUTURE) ×2 IMPLANT
SUT VIC AB 2-0 CT1 27 (SUTURE) ×3
SUT VIC AB 2-0 CT1 TAPERPNT 27 (SUTURE) ×2 IMPLANT
SUT VIC AB 3-0 PS2 18 (SUTURE) ×3
SUT VIC AB 3-0 PS2 18XBRD (SUTURE) ×1 IMPLANT
SYR CONTROL 10ML LL (SYRINGE) IMPLANT
TOWEL OR 17X24 6PK STRL BLUE (TOWEL DISPOSABLE) ×3 IMPLANT
TOWEL OR 17X26 10 PK STRL BLUE (TOWEL DISPOSABLE) ×3 IMPLANT
TUBE CONNECTING 12'X1/4 (SUCTIONS) ×1
TUBE CONNECTING 12X1/4 (SUCTIONS) ×2 IMPLANT
WASHER FLAT ACE (Orthopedic Implant) ×2 IMPLANT
WASHER PLAIN FLAT ACE NS 3PK (Orthopedic Implant) IMPLANT
WATER STERILE IRR 1000ML POUR (IV SOLUTION) ×3 IMPLANT
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
## 2015-09-11 NOTE — Anesthesia Preprocedure Evaluation (Addendum)
Anesthesia Evaluation  Patient identified by MRN, date of birth, ID band Patient awake    Reviewed: Allergy & Precautions, NPO status , Patient's Chart, lab work & pertinent test results  Airway Mallampati: I       Dental  (+) Teeth Intact, Dental Advisory Given   Pulmonary    breath sounds clear to auscultation       Cardiovascular  Rhythm:Regular Rate:Normal     Neuro/Psych    GI/Hepatic   Endo/Other    Renal/GU      Musculoskeletal   Abdominal   Peds  Hematology   Anesthesia Other Findings   Reproductive/Obstetrics                            Anesthesia Physical Anesthesia Plan  ASA: I  Anesthesia Plan: General   Post-op Pain Management: GA combined w/ Regional for post-op pain   Induction: Intravenous  Airway Management Planned: LMA  Additional Equipment:   Intra-op Plan:   Post-operative Plan:   Informed Consent: I have reviewed the patients History and Physical, chart, labs and discussed the procedure including the risks, benefits and alternatives for the proposed anesthesia with the patient or authorized representative who has indicated his/her understanding and acceptance.   Dental advisory given  Plan Discussed with: CRNA and Anesthesiologist  Anesthesia Plan Comments:         Anesthesia Quick Evaluation

## 2015-09-11 NOTE — Transfer of Care (Signed)
Immediate Anesthesia Transfer of Care Note  Patient: Nathaniel Aguilar  Procedure(s) Performed: Procedure(s): OPEN REDUCTION INTERNAL FIXATION (ORIF) RIGHT FOOT LISFRANC FRACTURE (Right)  Patient Location: PACU  Anesthesia Type:General  Level of Consciousness: sedated  Airway & Oxygen Therapy: Patient Spontanous Breathing and Patient connected to nasal cannula oxygen  Post-op Assessment: Report given to RN and Post -op Vital signs reviewed and stable  Post vital signs: Reviewed and stable  Last Vitals:  Filed Vitals:   09/11/15 0638  BP: 143/78  Pulse: 98  Temp: 36.7 C  Resp: 16    Complications: No apparent anesthesia complications

## 2015-09-11 NOTE — Brief Op Note (Signed)
09/11/2015  9:55 AM  PATIENT:  Nathaniel Aguilar  24 y.o. male  PRE-OPERATIVE DIAGNOSIS: 1.  Right foot lisfranc dislocation      2.  Right 1st MT fracture      3.  Right 2nd MT fracture      4.  Right 4th MT fracture      5.  Right 5th MT fracture   POST-OPERATIVE DIAGNOSIS:  Same  Procedure(s): 1. ORIF right 1st MT fracture   2. ORIF right lisfranc dislocation   3.  Closed treatment of right 2nd, 4th and 5th MT fractures   4.  AP, oblique and lateral xrays of the right foot  SURGEON:  Wylene Simmer, MD  ASSISTANT: n/a  ANESTHESIA:   General, regional  EBL:  minimal   TOURNIQUET:  58 min at 117 mm Hg  COMPLICATIONS:  None apparent  DISPOSITION:  Extubated, awake and stable to recovery.  DICTATION ID:  [id number]

## 2015-09-11 NOTE — Anesthesia Postprocedure Evaluation (Signed)
  Anesthesia Post-op Note  Patient: Nathaniel Aguilar  Procedure(s) Performed: Procedure(s): OPEN REDUCTION INTERNAL FIXATION (ORIF) RIGHT FOOT LISFRANC FRACTURE (Right)  Patient Location: PACU  Anesthesia Type:General and GA combined with regional for post-op pain  Level of Consciousness: awake, alert  and oriented  Airway and Oxygen Therapy: Patient Spontanous Breathing  Post-op Pain: mild  Post-op Assessment: Post-op Vital signs reviewed, Patient's Cardiovascular Status Stable, Respiratory Function Stable, Patent Airway and Pain level controlled              Post-op Vital Signs: stable  Last Vitals:  Filed Vitals:   09/11/15 1045  BP:   Pulse: 104  Temp:   Resp: 13    Complications: No apparent anesthesia complications

## 2015-09-11 NOTE — Anesthesia Procedure Notes (Addendum)
Anesthesia Regional Block:  Adductor canal block  Pre-Anesthetic Checklist: ,, timeout performed, Correct Patient, Correct Site, Correct Laterality, Correct Procedure, Correct Position, site marked, Risks and benefits discussed,  Surgical consent,  Pre-op evaluation,  At surgeon's request and post-op pain management  Laterality: Right  Prep: chloraprep       Needles:  Injection technique: Single-shot  Needle Type: Echogenic Stimulator Needle     Needle Length: 9cm 9 cm Needle Gauge: 21 and 21 G    Additional Needles:  Procedures: ultrasound guided (picture in chart) Adductor canal block Narrative:  Start time: 09/11/2015 7:40 AM End time: 09/11/2015 7:45 AM Injection made incrementally with aspirations every 5 mL.  Performed by: Personally   Additional Notes: 20 cc 0.5% Bupivacaine 1:200 Epi injected easily   Anesthesia Regional Block:  Popliteal block  Pre-Anesthetic Checklist: ,, timeout performed, Correct Patient, Correct Site, Correct Laterality, Correct Procedure, Correct Position, site marked, Risks and benefits discussed,  Surgical consent,  Pre-op evaluation,  At surgeon's request and post-op pain management  Laterality: Right  Prep: chloraprep       Needles:  Injection technique: Single-shot  Needle Type: Echogenic Stimulator Needle     Needle Length: 9cm 9 cm Needle Gauge: 21 and 21 G    Additional Needles:  Procedures: ultrasound guided (picture in chart) Popliteal block Narrative:  Start time: 09/11/2015 7:45 AM End time: 09/11/2015 7:50 AM Injection made incrementally with aspirations every 5 mL.  Performed by: Personally   Additional Notes: 25 cc 0.5% Bupivacaine with 1:200 epi injected easily   Procedure Name: LMA Insertion Date/Time: 09/11/2015 8:01 AM Performed by: Rush Farmer E Pre-anesthesia Checklist: Patient identified, Emergency Drugs available, Suction available, Patient being monitored and Timeout performed Patient  Re-evaluated:Patient Re-evaluated prior to inductionOxygen Delivery Method: Circle system utilized Preoxygenation: Pre-oxygenation with 100% oxygen Intubation Type: IV induction LMA: LMA inserted LMA Size: 4.0 Number of attempts: 1 Placement Confirmation: positive ETCO2 and breath sounds checked- equal and bilateral Tube secured with: Tape Dental Injury: Teeth and Oropharynx as per pre-operative assessment

## 2015-09-11 NOTE — Discharge Instructions (Signed)
Nathaniel Simmer, MD [place]  Please read the following information regarding your care after surgery.  Medications  See your discharge instructions.  Narcotic pain medicine (ex. oxycodone, Percocet, Vicodin) will cause constipation.  To prevent this problem, take the following medicines while you are taking any pain medicine. Peri-colace as prescribed on the discharge instructions.  Weight Bearing ? Bear weight when you are able on your operated leg or foot. ? Bear weight only on the heel of your operated foot in the post-op shoe. X Do not bear any weight on the operated leg or foot.  Cast / Splint / Dressing X Keep your splints clean and dry.  Dont put anything (coat hanger, pencil, etc) down inside of it.  If it gets damp, use a hair dryer on the cool setting to dry it.  If it gets soaked, call the office to schedule an appointment for a cast change. ? Remove your dressing 3 days after surgery and cover the incisions with dry dressings.    After your dressing, cast or splint is removed; you may shower, but do not soak or scrub the wound.  Allow the water to run over it, and then gently pat it dry.  Swelling It is normal for you to have swelling where you had surgery.  To reduce swelling and pain, keep your toes above your nose for at least 3 days after surgery.  It may be necessary to keep your foot or leg elevated for several weeks.  If it hurts, it should be elevated.  Follow Up Call my office at [phone] when you are discharged from the hospital or surgery center to schedule an appointment to be seen two weeks after surgery.  Call my office at [phone] if you develop a fever >101.5 F, nausea, vomiting, bleeding from the surgical site or severe pain.
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
## 2015-09-12 ENCOUNTER — Encounter (HOSPITAL_COMMUNITY): Payer: Self-pay | Admitting: Emergency Medicine

## 2015-09-12 ENCOUNTER — Emergency Department (HOSPITAL_COMMUNITY)
Admission: EM | Admit: 2015-09-12 | Discharge: 2015-09-12 | Disposition: A | Discharge status: Home / Self Care | Payer: BLUE CROSS/BLUE SHIELD | Attending: Emergency Medicine | Admitting: Emergency Medicine

## 2015-09-12 DIAGNOSIS — Z862 Personal history of diseases of the blood and blood-forming organs and certain disorders involving the immune mechanism: Secondary | ICD-10-CM | POA: Diagnosis not present

## 2015-09-12 DIAGNOSIS — Z791 Long term (current) use of non-steroidal anti-inflammatories (NSAID): Secondary | ICD-10-CM | POA: Insufficient documentation

## 2015-09-12 DIAGNOSIS — R339 Retention of urine, unspecified: Secondary | ICD-10-CM | POA: Insufficient documentation

## 2015-09-12 DIAGNOSIS — J45909 Unspecified asthma, uncomplicated: Secondary | ICD-10-CM | POA: Diagnosis not present

## 2015-09-12 DIAGNOSIS — Z8781 Personal history of (healed) traumatic fracture: Secondary | ICD-10-CM | POA: Diagnosis not present

## 2015-09-12 DIAGNOSIS — Z8701 Personal history of pneumonia (recurrent): Secondary | ICD-10-CM | POA: Diagnosis not present

## 2015-09-12 DIAGNOSIS — Z87440 Personal history of urinary (tract) infections: Secondary | ICD-10-CM | POA: Insufficient documentation

## 2015-09-12 LAB — URINALYSIS, ROUTINE W REFLEX MICROSCOPIC
BILIRUBIN URINE: NEGATIVE
GLUCOSE, UA: NEGATIVE mg/dL
HGB URINE DIPSTICK: NEGATIVE
Ketones, ur: NEGATIVE mg/dL
Leukocytes, UA: NEGATIVE
NITRITE: NEGATIVE
PROTEIN: NEGATIVE mg/dL
Specific Gravity, Urine: 1.021 (ref 1.005–1.030)
UROBILINOGEN UA: 0.2 mg/dL (ref 0.0–1.0)
pH: 6 (ref 5.0–8.0)

## 2015-09-12 MED ORDER — ALBUTEROL SULFATE (2.5 MG/3ML) 0.083% IN NEBU: 2.5000 mg | INHALATION_SOLUTION | Freq: Once | RESPIRATORY_TRACT | Status: DC

## 2015-09-12 NOTE — ED Notes (Signed)
Pt had surgery on right foot yesterday and has not voided since 0000 today. Denies pain, minimal distention. Reports last time he had surgery the same thing happened.
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
## 2015-09-12 NOTE — Discharge Instructions (Signed)
Acute Urinary Retention, Male Acute urinary retention is when you are unable to pee (urinate). Acute urinary retention is common in older men. Prostates can get bigger, which blocks the flow of pee.  HOME CARE  Drink enough fluids to keep your pee clear or pale yellow.  If you are sent home with a tube that drains the bladder (catheter), there will be a drainage bag attached to it. There are two types of bags. One is big that you can wear at night without having to empty it. One is smaller and needs to be emptied more often.  Keep the drainage bag empty.  Keep the drainage bag lower than your catheter.  Only take medicine as told by your doctor. GET HELP IF:  You have a low-grade fever.  You have spasms or you are leaking pee when you have spasms. GET HELP RIGHT AWAY IF:   You have chills or a fever.  Your catheter stops draining pee.  Your catheter falls out.  You have increased bleeding that does not stop after you have rested and increased the amount of fluids you had been drinking. MAKE SURE YOU:   Understand these instructions.  Will watch your condition.  Will get help right away if you are not doing well or get worse.   This information is not intended to replace advice given to you by your health care provider. Make sure you discuss any questions you have with your health care provider.   Document Released: 04/24/2008 Document Revised: 03/23/2015 Document Reviewed: 04/17/2013 Elsevier Interactive Patient Education [employer].  Urine shows no evidence of infection.   Call urologist in morning for follow up appt

## 2015-09-12 NOTE — ED Notes (Signed)
Pt is experiencing surgical pain to b/l LE however does not have any pain related in his bladder.

## 2015-09-13 ENCOUNTER — Encounter (HOSPITAL_COMMUNITY): Payer: Self-pay | Admitting: Orthopedic Surgery

## 2015-09-14 ENCOUNTER — Encounter (HOSPITAL_COMMUNITY): Payer: Self-pay | Admitting: Orthopedic Surgery
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
## 2022-06-15 ENCOUNTER — Other Ambulatory Visit (HOSPITAL_COMMUNITY): Payer: Self-pay | Admitting: Orthopedic Surgery

## 2022-07-05 ENCOUNTER — Other Ambulatory Visit: Payer: Self-pay

## 2022-07-05 ENCOUNTER — Encounter (HOSPITAL_BASED_OUTPATIENT_CLINIC_OR_DEPARTMENT_OTHER): Payer: Self-pay | Admitting: Orthopedic Surgery

## 2022-07-12 NOTE — Anesthesia Preprocedure Evaluation (Signed)
Anesthesia Evaluation  Patient identified by MRN, date of birth, ID band Patient awake    Reviewed: Allergy & Precautions, NPO status , Patient's Chart, lab work & pertinent test results  Airway Mallampati: II  TM Distance: >3 FB Neck ROM: Full    Dental no notable dental hx.    Pulmonary asthma ,    Pulmonary exam normal        Cardiovascular negative cardio ROS   Rhythm:Regular Rate:Normal     Neuro/Psych negative neurological ROS  negative psych ROS   GI/Hepatic negative GI ROS, Neg liver ROS,   Endo/Other  negative endocrine ROS  Renal/GU negative Renal ROS  negative genitourinary   Musculoskeletal  (+) Arthritis ,   Abdominal Normal abdominal exam  (+)   Peds  Hematology  (+) Blood dyscrasia, anemia ,   Anesthesia Other Findings   Reproductive/Obstetrics                            Anesthesia Physical Anesthesia Plan  ASA: 2  Anesthesia Plan: General   Post-op Pain Management: Celebrex PO (pre-op)* and Tylenol PO (pre-op)*   Induction: Intravenous  PONV Risk Score and Plan: 2 and Ondansetron, Dexamethasone, Midazolam and Treatment may vary due to age or medical condition  Airway Management Planned: Mask and LMA  Additional Equipment: None  Intra-op Plan:   Post-operative Plan: Extubation in OR  Informed Consent: I have reviewed the patients History and Physical, chart, labs and discussed the procedure including the risks, benefits and alternatives for the proposed anesthesia with the patient or authorized representative who has indicated his/her understanding and acceptance.     Dental advisory given  Plan Discussed with: CRNA  Anesthesia Plan Comments:        Anesthesia Quick Evaluation

## 2022-07-13 ENCOUNTER — Ambulatory Visit (HOSPITAL_BASED_OUTPATIENT_CLINIC_OR_DEPARTMENT_OTHER): Payer: BC Managed Care – PPO | Admitting: Anesthesiology

## 2022-07-13 ENCOUNTER — Ambulatory Visit (HOSPITAL_BASED_OUTPATIENT_CLINIC_OR_DEPARTMENT_OTHER)
Admission: RE | Admit: 2022-07-13 | Discharge: 2022-07-13 | Disposition: A | Discharge status: Home / Self Care | Payer: BC Managed Care – PPO | Attending: Orthopedic Surgery | Admitting: Orthopedic Surgery

## 2022-07-13 ENCOUNTER — Other Ambulatory Visit: Payer: Self-pay

## 2022-07-13 ENCOUNTER — Ambulatory Visit (HOSPITAL_BASED_OUTPATIENT_CLINIC_OR_DEPARTMENT_OTHER): Payer: BC Managed Care – PPO

## 2022-07-13 ENCOUNTER — Encounter (HOSPITAL_BASED_OUTPATIENT_CLINIC_OR_DEPARTMENT_OTHER): Payer: Self-pay | Admitting: Orthopedic Surgery

## 2022-07-13 ENCOUNTER — Encounter (HOSPITAL_BASED_OUTPATIENT_CLINIC_OR_DEPARTMENT_OTHER)
Admission: RE | Disposition: A | Discharge status: Home / Self Care | Payer: Self-pay | Source: Home / Self Care | Attending: Orthopedic Surgery

## 2022-07-13 DIAGNOSIS — T8484XA Pain due to internal orthopedic prosthetic devices, implants and grafts, initial encounter: Secondary | ICD-10-CM | POA: Insufficient documentation

## 2022-07-13 DIAGNOSIS — M1990 Unspecified osteoarthritis, unspecified site: Secondary | ICD-10-CM | POA: Insufficient documentation

## 2022-07-13 DIAGNOSIS — M899 Disorder of bone, unspecified: Secondary | ICD-10-CM | POA: Insufficient documentation

## 2022-07-13 DIAGNOSIS — Y831 Surgical operation with implant of artificial internal device as the cause of abnormal reaction of the patient, or of later complication, without mention of misadventure at the time of the procedure: Secondary | ICD-10-CM | POA: Diagnosis not present

## 2022-07-13 HISTORY — PX: HARDWARE REMOVAL: SHX979

## 2022-07-13 SURGERY — REMOVAL, HARDWARE
Anesthesia: General | Site: Foot | Laterality: Left

## 2022-07-13 MED ORDER — LACTATED RINGERS IV SOLN: INTRAVENOUS | Status: DC

## 2022-07-13 MED ORDER — ACETAMINOPHEN 500 MG PO TABS
1000.0000 mg | ORAL_TABLET | Freq: Once | ORAL | Status: AC
  Administered 2022-07-13: 1000 mg via ORAL

## 2022-07-13 MED ORDER — 0.9 % SODIUM CHLORIDE (POUR BTL) OPTIME
TOPICAL | Status: DC | PRN
  Administered 2022-07-13: 200 mL

## 2022-07-13 MED ORDER — PROPOFOL 10 MG/ML IV BOLUS
INTRAVENOUS | Status: DC | PRN
  Administered 2022-07-13: 200 mg via INTRAVENOUS

## 2022-07-13 MED ORDER — VANCOMYCIN HCL 500 MG IV SOLR
INTRAVENOUS | Status: DC | PRN
  Administered 2022-07-13: 500 mg via TOPICAL

## 2022-07-13 MED ORDER — SENNA 8.6 MG PO TABS: 2.0000 | ORAL_TABLET | Freq: Two times a day (BID) | ORAL | 0 refills | Status: AC

## 2022-07-13 MED ORDER — DOCUSATE SODIUM 100 MG PO CAPS: 100.0000 mg | ORAL_CAPSULE | Freq: Two times a day (BID) | ORAL | 0 refills | Status: AC

## 2022-07-13 MED ORDER — EPHEDRINE 5 MG/ML INJ
INTRAVENOUS | Status: AC
  Filled 2022-07-13: qty 15

## 2022-07-13 MED ORDER — LIDOCAINE 2% (20 MG/ML) 5 ML SYRINGE
INTRAMUSCULAR | Status: AC
  Filled 2022-07-13: qty 20

## 2022-07-13 MED ORDER — CEFAZOLIN SODIUM-DEXTROSE 2-4 GM/100ML-% IV SOLN
2.0000 g | INTRAVENOUS | Status: AC
  Administered 2022-07-13: 2 g via INTRAVENOUS

## 2022-07-13 MED ORDER — SUCCINYLCHOLINE CHLORIDE 200 MG/10ML IV SOSY
PREFILLED_SYRINGE | INTRAVENOUS | Status: AC
  Filled 2022-07-13: qty 30

## 2022-07-13 MED ORDER — CEFAZOLIN SODIUM-DEXTROSE 2-4 GM/100ML-% IV SOLN
INTRAVENOUS | Status: AC
Start: 2022-07-13 — End: ?
  Filled 2022-07-13: qty 100

## 2022-07-13 MED ORDER — FENTANYL CITRATE (PF) 100 MCG/2ML IJ SOLN: 25.0000 ug | INTRAMUSCULAR | Status: DC | PRN

## 2022-07-13 MED ORDER — PHENYLEPHRINE 80 MCG/ML (10ML) SYRINGE FOR IV PUSH (FOR BLOOD PRESSURE SUPPORT)
PREFILLED_SYRINGE | INTRAVENOUS | Status: AC
  Filled 2022-07-13: qty 30

## 2022-07-13 MED ORDER — BUPIVACAINE-EPINEPHRINE (PF) 0.5% -1:200000 IJ SOLN
INTRAMUSCULAR | Status: AC
  Filled 2022-07-13: qty 30

## 2022-07-13 MED ORDER — ONDANSETRON HCL 4 MG/2ML IJ SOLN
INTRAMUSCULAR | Status: AC
Start: 2022-07-13 — End: ?
  Filled 2022-07-13: qty 14

## 2022-07-13 MED ORDER — FENTANYL CITRATE (PF) 100 MCG/2ML IJ SOLN
INTRAMUSCULAR | Status: AC
  Filled 2022-07-13: qty 2

## 2022-07-13 MED ORDER — MIDAZOLAM HCL 5 MG/5ML IJ SOLN
INTRAMUSCULAR | Status: DC | PRN
  Administered 2022-07-13: 2 mg via INTRAVENOUS

## 2022-07-13 MED ORDER — PROPOFOL 500 MG/50ML IV EMUL
INTRAVENOUS | Status: DC | PRN
  Administered 2022-07-13: 25 ug/kg/min via INTRAVENOUS

## 2022-07-13 MED ORDER — VANCOMYCIN HCL 500 MG IV SOLR
INTRAVENOUS | Status: AC
Start: 2022-07-13 — End: ?
  Filled 2022-07-13: qty 20

## 2022-07-13 MED ORDER — ONDANSETRON HCL 4 MG/2ML IJ SOLN
INTRAMUSCULAR | Status: DC | PRN
  Administered 2022-07-13: 4 mg via INTRAVENOUS

## 2022-07-13 MED ORDER — CELECOXIB 200 MG PO CAPS
200.0000 mg | ORAL_CAPSULE | Freq: Once | ORAL | Status: AC
Start: 2022-07-13 — End: 2022-07-13
  Administered 2022-07-13: 200 mg via ORAL

## 2022-07-13 MED ORDER — BUPIVACAINE-EPINEPHRINE (PF) 0.5% -1:200000 IJ SOLN
INTRAMUSCULAR | Status: DC | PRN
  Administered 2022-07-13: 10 mL

## 2022-07-13 MED ORDER — ACETAMINOPHEN 500 MG PO TABS
ORAL_TABLET | ORAL | Status: AC
  Filled 2022-07-13: qty 1

## 2022-07-13 MED ORDER — SODIUM CHLORIDE 0.9 % IV SOLN: INTRAVENOUS | Status: DC

## 2022-07-13 MED ORDER — FENTANYL CITRATE (PF) 100 MCG/2ML IJ SOLN
INTRAMUSCULAR | Status: DC | PRN
  Administered 2022-07-13: 25 ug via INTRAVENOUS
  Administered 2022-07-13: 50 ug via INTRAVENOUS
  Administered 2022-07-13: 25 ug via INTRAVENOUS

## 2022-07-13 MED ORDER — DEXAMETHASONE SODIUM PHOSPHATE 10 MG/ML IJ SOLN
INTRAMUSCULAR | Status: AC
Start: 2022-07-13 — End: ?
  Filled 2022-07-13: qty 2

## 2022-07-13 MED ORDER — LIDOCAINE 2% (20 MG/ML) 5 ML SYRINGE
INTRAMUSCULAR | Status: DC | PRN
  Administered 2022-07-13: 60 mg via INTRAVENOUS

## 2022-07-13 MED ORDER — CELECOXIB 200 MG PO CAPS
ORAL_CAPSULE | ORAL | Status: AC
  Filled 2022-07-13: qty 1

## 2022-07-13 MED ORDER — OXYCODONE HCL 5 MG PO TABS: 5.0000 mg | ORAL_TABLET | Freq: Four times a day (QID) | ORAL | 0 refills | Status: AC | PRN

## 2022-07-13 MED ORDER — VANCOMYCIN HCL 1000 MG IV SOLR
INTRAVENOUS | Status: AC
  Filled 2022-07-13: qty 20

## 2022-07-13 MED ORDER — PROPOFOL 500 MG/50ML IV EMUL
INTRAVENOUS | Status: AC
  Filled 2022-07-13: qty 100

## 2022-07-13 MED ORDER — MIDAZOLAM HCL 2 MG/2ML IJ SOLN
INTRAMUSCULAR | Status: AC
  Filled 2022-07-13: qty 2

## 2022-07-13 SURGICAL SUPPLY — 70 items
APL PRP STRL LF DISP 70% ISPRP (MISCELLANEOUS) ×1
APL SKNCLS STERI-STRIP NONHPOA (GAUZE/BANDAGES/DRESSINGS)
BANDAGE ESMARK 6X9 LF (GAUZE/BANDAGES/DRESSINGS) IMPLANT
BENZOIN TINCTURE PRP APPL 2/3 (GAUZE/BANDAGES/DRESSINGS) IMPLANT
BLADE AVERAGE 25X9 (BLADE) IMPLANT
BLADE LONG MED 25X9 (BLADE) IMPLANT
BLADE MICRO SAGITTAL (BLADE) IMPLANT
BLADE MINI RND TIP GREEN BEAV (BLADE) IMPLANT
BLADE SURG 15 STRL LF DISP TIS (BLADE) ×2 IMPLANT
BLADE SURG 15 STRL SS (BLADE) ×3
BNDG CMPR 9X4 STRL LF SNTH (GAUZE/BANDAGES/DRESSINGS)
BNDG CMPR 9X6 STRL LF SNTH (GAUZE/BANDAGES/DRESSINGS)
BNDG ELASTIC 4X5.8 VLCR STR LF (GAUZE/BANDAGES/DRESSINGS) IMPLANT
BNDG ELASTIC 6X5.8 VLCR STR LF (GAUZE/BANDAGES/DRESSINGS) IMPLANT
BNDG ESMARK 4X9 LF (GAUZE/BANDAGES/DRESSINGS) IMPLANT
BNDG ESMARK 6X9 LF (GAUZE/BANDAGES/DRESSINGS)
CHLORAPREP W/TINT 26 (MISCELLANEOUS) ×1 IMPLANT
COVER BACK TABLE 60X90IN (DRAPES) ×2 IMPLANT
CUFF TOURN SGL QUICK 34 (TOURNIQUET CUFF)
CUFF TRNQT CYL 34X4.125X (TOURNIQUET CUFF) IMPLANT
DRAPE EXTREMITY T 121X128X90 (DISPOSABLE) ×1 IMPLANT
DRAPE OEC MINIVIEW 54X84 (DRAPES) IMPLANT
DRAPE SURG 17X23 STRL (DRAPES) IMPLANT
DRAPE U-SHAPE 47X51 STRL (DRAPES) ×1 IMPLANT
DRSG MEPITEL 4X7.2 (GAUZE/BANDAGES/DRESSINGS) ×1 IMPLANT
DRSG PAD ABDOMINAL 8X10 ST (GAUZE/BANDAGES/DRESSINGS) IMPLANT
ELECT REM PT RETURN 9FT ADLT (ELECTROSURGICAL) ×1
ELECTRODE REM PT RTRN 9FT ADLT (ELECTROSURGICAL) ×1 IMPLANT
GAUZE SPONGE 4X4 12PLY STRL (GAUZE/BANDAGES/DRESSINGS) ×1 IMPLANT
GLOVE BIO SURGEON STRL SZ8 (GLOVE) ×1 IMPLANT
GLOVE BIOGEL PI IND STRL 7.0 (GLOVE) IMPLANT
GLOVE BIOGEL PI IND STRL 8 (GLOVE) ×4 IMPLANT
GLOVE BIOGEL PI INDICATOR 7.0 (GLOVE) ×2
GLOVE BIOGEL PI INDICATOR 8 (GLOVE) ×2
GLOVE ECLIPSE 8.0 STRL XLNG CF (GLOVE) ×1 IMPLANT
GLOVE SURG SS PI 6.5 STRL IVOR (GLOVE) IMPLANT
GOWN STRL REUS W/ TWL LRG LVL3 (GOWN DISPOSABLE) ×1 IMPLANT
GOWN STRL REUS W/ TWL XL LVL3 (GOWN DISPOSABLE) ×2 IMPLANT
GOWN STRL REUS W/TWL LRG LVL3 (GOWN DISPOSABLE) ×1
GOWN STRL REUS W/TWL XL LVL3 (GOWN DISPOSABLE) ×2
NEEDLE HYPO 22GX1.5 SAFETY (NEEDLE) IMPLANT
PACK BASIN DAY SURGERY FS (CUSTOM PROCEDURE TRAY) ×1 IMPLANT
PAD CAST 4YDX4 CTTN HI CHSV (CAST SUPPLIES) ×1 IMPLANT
PADDING CAST COTTON 4X4 STRL (CAST SUPPLIES) ×1
PADDING CAST COTTON 6X4 STRL (CAST SUPPLIES) IMPLANT
PENCIL SMOKE EVACUATOR (MISCELLANEOUS) ×1 IMPLANT
SANITIZER HAND PURELL 535ML FO (MISCELLANEOUS) ×1 IMPLANT
SHEET MEDIUM DRAPE 40X70 STRL (DRAPES) ×2 IMPLANT
SLEEVE SCD COMPRESS KNEE MED (STOCKING) ×1 IMPLANT
SPIKE FLUID TRANSFER (MISCELLANEOUS) IMPLANT
SPLINT FAST PLASTER 5X30 (CAST SUPPLIES)
SPLINT PLASTER CAST FAST 5X30 (CAST SUPPLIES) IMPLANT
SPONGE SURGIFOAM ABS GEL 12-7 (HEMOSTASIS) ×1 IMPLANT
SPONGE T-LAP 18X18 ~~LOC~~+RFID (SPONGE) ×1 IMPLANT
STOCKINETTE 6  STRL (DRAPES) ×1
STOCKINETTE 6 STRL (DRAPES) ×1 IMPLANT
STRIP CLOSURE SKIN 1/2X4 (GAUZE/BANDAGES/DRESSINGS) IMPLANT
SUCTION FRAZIER HANDLE 10FR (MISCELLANEOUS) ×1
SUCTION TUBE FRAZIER 10FR DISP (MISCELLANEOUS) IMPLANT
SUT ETHILON 3 0 PS 1 (SUTURE) IMPLANT
SUT MNCRL AB 3-0 PS2 18 (SUTURE) ×1 IMPLANT
SUT VIC AB 2-0 SH 18 (SUTURE) IMPLANT
SUT VIC AB 2-0 SH 27 (SUTURE)
SUT VIC AB 2-0 SH 27XBRD (SUTURE) IMPLANT
SUT VICRYL 0 SH 27 (SUTURE) IMPLANT
SYR BULB EAR ULCER 3OZ GRN STR (SYRINGE) ×1 IMPLANT
SYR CONTROL 10ML LL (SYRINGE) IMPLANT
TOWEL GREEN STERILE FF (TOWEL DISPOSABLE) ×1 IMPLANT
TUBE CONNECTING 20X1/4 (TUBING) IMPLANT
UNDERPAD 30X36 HEAVY ABSORB (UNDERPADS AND DIAPERS) ×2 IMPLANT

## 2022-07-13 NOTE — Transfer of Care (Signed)
Immediate Anesthesia Transfer of Care Note  Patient: Nathaniel Aguilar  Procedure(s) Performed: Left foot painful hardware, plantar exostosis (Left: Foot)  Patient Location: PACU  Anesthesia Type:General  Level of Consciousness: drowsy and patient cooperative  Airway & Oxygen Therapy: Patient Spontanous Breathing and Patient connected to face mask oxygen  Post-op Assessment: Report given to RN and Post -op Vital signs reviewed and stable  Post vital signs: Reviewed and stable  Last Vitals:  Vitals Value Taken Time  BP 110/62 07/13/22 0823  Temp    Pulse 70 07/13/22 0824  Resp 11 07/13/22 0824  SpO2 97 % 07/13/22 0824  Vitals shown include unvalidated device data.  Last Pain:  Vitals:   07/13/22 0634  TempSrc: Oral  PainSc: 2          Complications: No notable events documented.

## 2022-07-13 NOTE — Discharge Instructions (Addendum)
Nathaniel Simmer, MD [facility]  Please read the following information regarding your care after surgery.  Medications  You only need a prescription for the narcotic pain medicine (ex. oxycodone, Percocet, Norco).  All of the other medicines listed below are available over the counter. ? Aleve 2 pills twice a day for the first 3 days after surgery. ? acetominophen (Tylenol) 650 mg every 4-6 hours as you need for minor to moderate pain ? oxycodone as prescribed for severe pain  Narcotic pain medicine (ex. oxycodone, Percocet, Vicodin) will cause constipation.  To prevent this problem, take the following medicines while you are taking any pain medicine. ? docusate sodium (Colace) 100 mg twice a day ? senna (Senokot) 2 tablets twice a day  Weight Bearing ? Bear weight only on your operated foot in the post-op shoe.   Cast / Splint / Dressing ? Keep your splint, cast or dressing clean and dry.  Don't put anything (coat hanger, pencil, etc) down inside of it.  If it gets damp, use a hair dryer on the cool setting to dry it.  If it gets soaked, call the office to schedule an appointment for a cast change.   After your dressing, cast or splint is removed; you may shower, but do not soak or scrub the wound.  Allow the water to run over it, and then gently pat it dry.  Swelling It is normal for you to have swelling where you had surgery.  To reduce swelling and pain, keep your toes above your nose for at least 3 days after surgery.  It may be necessary to keep your foot or leg elevated for several weeks.  If it hurts, it should be elevated.  Follow Up Call my office at [phone] when you are discharged from the hospital or surgery center to schedule an appointment to be seen two weeks after surgery.  Call my office at [phone] if you develop a fever >101.5 F, nausea, vomiting, bleeding from the surgical site or severe pain.     Post Anesthesia Home Care Instructions  Activity: Get  plenty of rest for the remainder of the day. A responsible individual must stay with you for 24 hours following the procedure.  For the next 24 hours, DO NOT: -Drive a car -Paediatric nurse -Drink alcoholic beverages -Take any medication unless instructed by your physician -Make any legal decisions or sign important papers.  Meals: Start with liquid foods such as gelatin or soup. Progress to regular foods as tolerated. Avoid greasy, spicy, heavy foods. If nausea and/or vomiting occur, drink only clear liquids until the nausea and/or vomiting subsides. Call your physician if vomiting continues.  Special Instructions/Symptoms: Your throat may feel dry or sore from the anesthesia or the breathing tube placed in your throat during surgery. If this causes discomfort, gargle with warm salt water. The discomfort should disappear within 24 hours.
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
## 2022-07-13 NOTE — Anesthesia Procedure Notes (Signed)
Procedure Name: LMA Insertion Date/Time: 07/13/2022 7:37 AM  Performed by: Said Rueb, Ernesta Amble, CRNAPre-anesthesia Checklist: Patient identified, Emergency Drugs available, Suction available and Patient being monitored Patient Re-evaluated:Patient Re-evaluated prior to induction Oxygen Delivery Method: Circle system utilized Preoxygenation: Pre-oxygenation with 100% oxygen Induction Type: IV induction Ventilation: Mask ventilation without difficulty LMA: LMA inserted LMA Size: 4.0 Number of attempts: 1 Airway Equipment and Method: Bite block Placement Confirmation: positive ETCO2 Tube secured with: Tape Dental Injury: Teeth and Oropharynx as per pre-operative assessment

## 2022-07-13 NOTE — Anesthesia Postprocedure Evaluation (Signed)
Anesthesia Post Note  Patient: Nathaniel Aguilar  Procedure(s) Performed: Left foot painful hardware, plantar exostosis (Left: Foot)     Patient location during evaluation: PACU Anesthesia Type: General Level of consciousness: awake and alert Pain management: pain level controlled Vital Signs Assessment: post-procedure vital signs reviewed and stable Respiratory status: spontaneous breathing, nonlabored ventilation, respiratory function stable and patient connected to nasal cannula oxygen Cardiovascular status: blood pressure returned to baseline and stable Postop Assessment: no apparent nausea or vomiting Anesthetic complications: no   No notable events documented.  Last Vitals:  Vitals:   07/13/22 0915 07/13/22 0928  BP: 122/78 129/74  Pulse: 79 96  Resp: 15 16  Temp:  (!) 36.3 C  SpO2: 98% 96%    Last Pain:  Vitals:   07/13/22 0928  TempSrc:   PainSc: 3                  Belenda Cruise P Ciel Yanes

## 2022-07-14 ENCOUNTER — Encounter (HOSPITAL_BASED_OUTPATIENT_CLINIC_OR_DEPARTMENT_OTHER): Payer: Self-pay | Admitting: Orthopedic Surgery
